# Patient Record
Sex: Female | Born: 1995 | Race: White | Hispanic: No | Marital: Married | Smoking: Never smoker
Health system: Southern US, Community
[De-identification: ages and names within clinical notes are randomized; demographics above are authoritative.]

## PROBLEM LIST (undated history)

## (undated) ENCOUNTER — Inpatient Hospital Stay (HOSPITAL_COMMUNITY): Payer: Self-pay

## (undated) DIAGNOSIS — K589 Irritable bowel syndrome without diarrhea: Secondary | ICD-10-CM

## (undated) DIAGNOSIS — J45909 Unspecified asthma, uncomplicated: Secondary | ICD-10-CM

## (undated) HISTORY — DX: Unspecified asthma, uncomplicated: J45.909

## (undated) HISTORY — DX: Irritable bowel syndrome, unspecified: K58.9

## (undated) HISTORY — PX: WISDOM TOOTH EXTRACTION: SHX21

---
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
## 2015-01-21 ENCOUNTER — Telehealth: Payer: Self-pay | Admitting: Family Medicine

## 2015-01-21 NOTE — Telephone Encounter (Signed)
Please call the patient and see if they can be seen. [facility]

## 2015-01-21 NOTE — Telephone Encounter (Signed)
Pt's father Caryn Bee said she needed a new prescription for her Qbar 40 mcg inhaler sent to [facility] in [place].  His call back number is [phone]

## 2015-01-21 NOTE — Telephone Encounter (Signed)
Yes, he needs to be seen.-jh

## 2015-01-21 NOTE — Telephone Encounter (Signed)
Patient last saw [facility]. Had 32m RX for Qvar. He has scheduled Asthma appt and was hoping we could call in one Qvar until seen. Is this ok? [facility]

## 2015-01-31 ENCOUNTER — Telehealth: Payer: Self-pay | Admitting: Family Medicine

## 2015-02-04 ENCOUNTER — Ambulatory Visit (INDEPENDENT_AMBULATORY_CARE_PROVIDER_SITE_OTHER): Payer: BC Managed Care – PPO | Admitting: Family Medicine

## 2015-02-04 ENCOUNTER — Encounter: Payer: Self-pay | Admitting: Family Medicine

## 2015-02-04 VITALS — BP 102/65 | HR 60 | Temp 98.0°F | Resp 16 | Ht 64.0 in | Wt 155.6 lb

## 2015-02-04 DIAGNOSIS — J4520 Mild intermittent asthma, uncomplicated: Secondary | ICD-10-CM

## 2015-02-04 DIAGNOSIS — K589 Irritable bowel syndrome without diarrhea: Secondary | ICD-10-CM | POA: Insufficient documentation

## 2015-02-04 DIAGNOSIS — Z23 Encounter for immunization: Secondary | ICD-10-CM | POA: Diagnosis not present

## 2015-02-04 DIAGNOSIS — J45909 Unspecified asthma, uncomplicated: Secondary | ICD-10-CM | POA: Insufficient documentation

## 2015-02-04 MED ORDER — ALBUTEROL SULFATE HFA 108 (90 BASE) MCG/ACT IN AERS: 2.0000 | INHALATION_SPRAY | Freq: Four times a day (QID) | RESPIRATORY_TRACT | Status: DC | PRN

## 2015-02-04 MED ORDER — BECLOMETHASONE DIPROPIONATE 40 MCG/ACT IN AERS: 2.0000 | INHALATION_SPRAY | Freq: Two times a day (BID) | RESPIRATORY_TRACT | Status: DC

## 2015-02-04 NOTE — Progress Notes (Signed)
Name: Janet Edwards   MRN: [id number]    DOB: [birth year]   Date:02/04/2015       Progress Note  Subjective  Chief Complaint  Chief Complaint  Patient presents with  . Asthma    medication refill Qvar    HPI Hx of asthma.  No bad flairs in many yrs.  Some exercise asthma.  Using QVAR only once a day.  Rarely needs Ventolin.  No problem-specific assessment & plan notes found for this encounter.   Past Medical History  Diagnosis Date  . IBS (irritable bowel syndrome)   . Asthma     Social History  Substance Use Topics  . Smoking status: Never Smoker   . Smokeless tobacco: Not on file  . Alcohol Use: No     Current outpatient prescriptions:  .  albuterol (PROVENTIL HFA;VENTOLIN HFA) 108 (90 BASE) MCG/ACT inhaler, Inhale 2 puffs into the lungs every 6 (six) hours as needed for wheezing or shortness of breath., Disp: , Rfl:  .  beclomethasone (QVAR) 40 MCG/ACT inhaler, Inhale 2 puffs into the lungs 2 (two) times daily., Disp: , Rfl:   No Known Allergies  Review of Systems  Constitutional: Negative for fever, chills, weight loss and malaise/fatigue.  HENT: Negative for hearing loss.   Eyes: Negative for blurred vision and double vision.  Respiratory: Positive for wheezing (with exercise and heat.). Negative for cough, sputum production and shortness of breath.   Cardiovascular: Negative for chest pain, palpitations and leg swelling.  Gastrointestinal: Negative for heartburn, abdominal pain and blood in stool.  Genitourinary: Negative for dysuria, urgency and frequency.  Skin: Negative for rash.  Neurological: Negative for dizziness, sensory change, focal weakness, weakness and headaches.      Objective  Filed Vitals:   02/04/15 1104  BP: 102/65  Pulse: 60  Temp: 98 F (36.7 C)  TempSrc: Oral  Resp: 16  Height:  (1.626 m)  Weight: 155 lb 9.6 oz (70.58 kg)     Physical Exam  Constitutional: She is well-developed, well-nourished, and in no distress. No  distress.  HENT:  Head: Normocephalic and atraumatic.  Eyes: Conjunctivae and EOM are normal. Pupils are equal, round, and reactive to light. No scleral icterus.  Neck: Normal range of motion. Neck supple. Carotid bruit is not present. No thyromegaly present.  Cardiovascular: Normal rate, regular rhythm, normal heart sounds and intact distal pulses.  Exam reveals no gallop and no friction rub.   No murmur heard. Pulmonary/Chest: Effort normal and breath sounds normal. No respiratory distress. She has no wheezes. She exhibits no tenderness.  Abdominal: Soft. Bowel sounds are normal. She exhibits no distension, no abdominal bruit and no mass. There is no tenderness.  Lymphadenopathy:    She has no cervical adenopathy.  Vitals reviewed.     No results found for this or any previous visit (from the past 2160 hour(s)).   Assessment & Plan  1. Asthma, mild intermittent, uncomplicated  - albuterol (PROVENTIL HFA;VENTOLIN HFA) 108 (90 BASE) MCG/ACT inhaler; Inhale 2 puffs into the lungs every 6 (six) hours as needed for wheezing or shortness of breath.  Dispense: 1 Inhaler; Refill: 12 - beclomethasone (QVAR) 40 MCG/ACT inhaler; Inhale 2 puffs into the lungs 2 (two) times daily.  Dispense: 1 Inhaler; Refill: 12  2. Need for influenza vaccination

## 2015-02-04 NOTE — Patient Instructions (Signed)
Awareness of severe asthma attack discussed.

## 2015-03-02 NOTE — Telephone Encounter (Signed)
Error

## 2015-09-08 ENCOUNTER — Telehealth: Payer: Self-pay | Admitting: Family Medicine

## 2015-09-08 NOTE — Telephone Encounter (Addendum)
I received a request to complete vaccination forms for college for this patient. She is missing documentation of the required vaccinations I have looked on [employer] and in the old system. Did she get vaccines out of state? Does she have other vaccine records at home? Please call her to determine if we need to update her vaccinations or get more documentation.

## 2015-09-09 NOTE — Telephone Encounter (Signed)
Pt realized she does not need form completed. Will shred forms.

## 2015-09-09 NOTE — Telephone Encounter (Signed)
Patient will check with her mother to see if she has copy of vaccine records. Patient will call office back this afternoon. Patient states she probably is due for some vaccines.

## 2016-11-02 ENCOUNTER — Encounter: Payer: Self-pay | Admitting: Family Medicine

## 2016-11-02 ENCOUNTER — Ambulatory Visit (INDEPENDENT_AMBULATORY_CARE_PROVIDER_SITE_OTHER): Payer: BC Managed Care – PPO | Admitting: Family Medicine

## 2016-11-02 VITALS — BP 106/59 | HR 85 | Temp 98.4°F | Resp 16 | Ht 64.0 in | Wt 146.0 lb

## 2016-11-02 DIAGNOSIS — J029 Acute pharyngitis, unspecified: Secondary | ICD-10-CM

## 2016-11-02 DIAGNOSIS — J4520 Mild intermittent asthma, uncomplicated: Secondary | ICD-10-CM

## 2016-11-02 DIAGNOSIS — J209 Acute bronchitis, unspecified: Secondary | ICD-10-CM | POA: Diagnosis not present

## 2016-11-02 MED ORDER — AZITHROMYCIN 250 MG PO TABS: ORAL_TABLET | ORAL | 0 refills | Status: DC

## 2016-11-02 NOTE — Patient Instructions (Addendum)
Thank you for coming to the clinic today.  1. It sounds like you had an Upper Respiratory Virus that has settled into a Bronchitis, lower respiratory tract infection. I don't have concerns for pneumonia today, and think that this should gradually improve. Once you are feeling better, the cough may take a few weeks to fully resolve. I do hear coarse congestion breath sounds  Start Mucinex (or if prefer can get Mucinex-DM),  May also take DayQuil / NyQuil  - Use Albuterol inhaler 2 puffs every 6 hours around the clock for next 2-3 days  - IF not improving after 24-48 hours, or develop worsening fever, productive cough then start Azithromycin Z pak (antibiotic) 2 tabs day 1, then 1 tab x 4 days, complete entire course even if improved  - Use nasal saline (Simply Saline or Ocean Spray) to flush nasal congestion multiple times a day, may help cough - Drink plenty of fluids to improve congestion  If your symptoms seem to worsen instead of improve over next several days, including significant fever / chills, worsening shortness of breath, worsening wheezing, or nausea / vomiting and can't take medicines - return sooner or go to hospital Emergency Department for more immediate treatment.  Please schedule a Follow-up Appointment to: Return in about 2 weeks (around 11/16/2016), or if symptoms worsen or fail to improve, for Bronchitis.  If you have any other questions or concerns, please feel free to call the clinic or send a message through MyChart. You may also schedule an earlier appointment if necessary.  Additionally, you may be receiving a survey about your experience at our clinic within a few days to 1 week by e-mail or mail. We value your feedback.  Saralyn PilarAlexander Akin Yi, DO [facility]

## 2016-11-02 NOTE — Progress Notes (Signed)
Subjective:    Patient ID: Janet Edwards, female    DOB: [birth year], 21 y.o.   MRN: [id number]  Janet Edwards is a 21 y.o. female presenting on 11/02/2016 for Laryngitis (had sore throat Monday, Tuesday felt better and onset thursday had laryngitis and chest congestion Head pressure )  Patient presents for a same day appointment.  HPI   URI / PHARYNGITIS vs Bronchitis / History of Asthma Reports symptoms started with sore throat when woke up on Monday, did not have throat swelling or white bumps, then felt better for 2 days, and then symptoms progressed to more chest congestion and sore with coughing. - Tried Mucinex last night without significant relief. Tried some Tylenol initially then not resumed. - History of asthma, mild intermittent mostly exercise, rarely using, has not used albuterol in >1 year no recent flares, has not tried albuterol recently. never on singulair  - Admits sore throat and "thicker mucus" on swallowing, cough but difficulty mobilizing mucus - Denies shortness of breath, fevers/chills, sweats, abdominal pain, nausea, vomiting diarrhea, sinus or nasal congestion, ear pain or pressure   Social History  Substance Use Topics  . Smoking status: Never Smoker  . Smokeless tobacco: Never Used  . Alcohol use No    Review of Systems Per HPI unless specifically indicated above     Objective:    BP (!) 106/59   Pulse 85   Temp 98.4 F (36.9 C) (Oral)   Resp 16   Ht 5\' 4"  (1.626 m)   Wt 146 lb (66.2 kg)   SpO2 100%   BMI 25.06 kg/m   Wt Readings from Last 3 Encounters:  11/02/16 146 lb (66.2 kg)  02/04/15 155 lb 9.6 oz (70.6 kg) (84 %, Z= 1.01)*  12/18/12 139 lb (63 kg) (75 %, Z= 0.68)*   * Growth percentiles are based on CDC 2-20 Years data.    Physical Exam  Constitutional: She is oriented to person, place, and time. She appears well-developed and well-nourished. No distress.  Mostly well appearing, comfortable, cooperative  HENT:  Head:  Normocephalic and atraumatic.  Mouth/Throat: Oropharynx is clear and moist.  Frontal sinuses mild discomfort to palpation / maxillary sinuses non-tender. Nares patent without purulence or edema. Bilateral TMs clear without erythema, effusion or bulging. Oropharynx with mild generalized erythema posteriorly, non specific, without exudates, edema or asymmetry.  Eyes: Conjunctivae are normal. Right eye exhibits no discharge. Left eye exhibits no discharge.  Neck: Normal range of motion. Neck supple.  Cardiovascular: Normal rate, regular rhythm, normal heart sounds and intact distal pulses.   No murmur heard. Pulmonary/Chest: Effort normal. No respiratory distress. She has no wheezes. She has no rales.  Mild coarse transmitted upper airway breath sounds, clear with cough. No focal crackles or rhonchi. No wheezing.  Musculoskeletal: Normal range of motion. She exhibits no edema.  Lymphadenopathy:    She has no cervical adenopathy.  Neurological: She is alert and oriented to person, place, and time.  Skin: Skin is warm and dry. No rash noted. She is not diaphoretic. No erythema.  Psychiatric: She has a normal mood and affect. Her behavior is normal.  Nursing note and vitals reviewed.  No results found for this or any previous visit.    Assessment & Plan:   Problem List Items Addressed This Visit    Asthma    Stable without evidence of acute exacerbation. Well controlled or resolving mild intermittent asthma - was on Qvar 2 puff BID now, not using  regularly. Rare flares, last >1 year ago, not using albuterol. Trigger mostly exercise induced No wheezing today but some mild coarse transmitted sounds, may be developing slight obstruction  Plan: 1. See A&P, likely viral self limited 2. Hold prednisone 3. Recommend brief trial on existing Albuterol 2 puffs q 4-6 hour daily for 48 hours then PRN 4. Future can STOP Qvar, and try without to see how well controlled, consider Singulair for  exercise-induced 5. Return criteria given to follow-up vs when to go to ED - if worsening by next week despite antibiotic, may consider Prednisone burst       Other Visit Diagnoses    Acute bronchitis, unspecified organism    -  Primary  Consistent with worsening bronchitis in setting of likely viral URI - Afebrile, no focal signs of infection (not consistent with pneumonia by history or exam), no evidence sinusitis. Mild coarse breath sounds on exam concern for some bronchospasm (with known asthma), but not consistent with acute flare.  Plan: 1. Reassurance, continue supportive measures - resume Mucinex daily for 7-10 days for chest congestion, add DayQuil/NyQuil other meds PRN 2. If not improved 24-48 hours, since weekend coming up, agreed on decision with patient for Azithromycin Z-pak rx printed only start if worsening, otherwise don't take if improving 3. Use albuterol 4. Offered tessalon perls, declined 5. Nasal saline, lozenges, tea with honey/lemon 6. Return criteria reviewed, follow-up within 1-2 week if not improved     Relevant Medications   azithromycin (ZITHROMAX Z-PAK) 250 MG tablet   Pharyngitis, unspecified etiology   - Significant cough, not meeting criteria for rapid strep today based on exam. - Follow-up if not improved, likely from cough / bronchitis drainage URI        Meds ordered this encounter  Medications  . JUNEL 1/20 1-20 MG-MCG tablet  . azithromycin (ZITHROMAX Z-PAK) 250 MG tablet    Sig: Start within 24-48 hours if not improved. Take 2 tabs (500mg  total) on Day 1. Take 1 tab (250mg ) daily for next 4 days.    Dispense:  6 tablet    Refill:  0      Follow up plan: Return in about 2 weeks (around 11/16/2016), or if symptoms worsen or fail to improve, for Bronchitis.  Additionally, last visit in 2016 with prior PCP Dr Juanetta GoslingHawkins. Advised that we recommend yearly visit for health maintenance/annual physical, she may follow-up with myself or Wilhelmina McardleLauren  Kennedy, AGPCNP-BC  Saralyn PilarAlexander Karamalegos, DO [facility] 11/02/2016, 12:34 PM

## 2016-11-02 NOTE — Assessment & Plan Note (Addendum)
Stable without evidence of acute exacerbation. Well controlled or resolving mild intermittent asthma - was on Qvar 2 puff BID now, not using regularly. Rare flares, last >1 year ago, not using albuterol. Trigger mostly exercise induced No wheezing today but some mild coarse transmitted sounds, may be developing slight obstruction  Plan: 1. See A&P, likely viral self limited 2. Hold prednisone 3. Recommend brief trial on existing Albuterol 2 puffs q 4-6 hour daily for 48 hours then PRN 4. Future can STOP Qvar, and try without to see how well controlled, consider Singulair for exercise-induced 5. Return criteria given to follow-up vs when to go to ED - if worsening by next week despite antibiotic, may consider Prednisone burst

## 2019-08-10 ENCOUNTER — Ambulatory Visit: Payer: Self-pay | Attending: Internal Medicine

## 2019-08-10 DIAGNOSIS — Z20822 Contact with and (suspected) exposure to covid-19: Secondary | ICD-10-CM

## 2019-08-11 LAB — SARS-COV-2, NAA 2 DAY TAT

## 2019-08-11 LAB — NOVEL CORONAVIRUS, NAA: SARS-CoV-2, NAA: NOT DETECTED

## 2020-05-21 NOTE — L&D Delivery Note (Signed)
Delivery Note At 8:53 AM a viable female was delivered via Vaginal, Spontaneous (Presentation: Right Occiput Anterior).  APGAR: 8, 9; weight pending.   Placenta status: Spontaneous, Intact.  Cord: 3 vessels with the following complications: tight nuchal x 1; reduced.  Cord pH: n/a.  Anesthesia: Epidural Episiotomy: None Lacerations: 2nd degree Suture Repair: 3.0 vicryl rapide Est. Blood Loss (mL): 300  Mom to postpartum.  Baby to Couplet care / Skin to Skin.  Mitchel Honour 03/02/2021, 9:24 AM

## 2020-07-15 ENCOUNTER — Inpatient Hospital Stay (HOSPITAL_COMMUNITY)
Admission: AD | Admit: 2020-07-15 | Discharge: 2020-07-15 | Disposition: A | Discharge status: Home / Self Care | Payer: 59 | Attending: Obstetrics and Gynecology | Admitting: Obstetrics and Gynecology

## 2020-07-15 ENCOUNTER — Encounter (HOSPITAL_COMMUNITY): Payer: Self-pay | Admitting: Obstetrics and Gynecology

## 2020-07-15 ENCOUNTER — Other Ambulatory Visit: Payer: Self-pay

## 2020-07-15 DIAGNOSIS — Z880 Allergy status to penicillin: Secondary | ICD-10-CM | POA: Diagnosis not present

## 2020-07-15 DIAGNOSIS — O219 Vomiting of pregnancy, unspecified: Secondary | ICD-10-CM | POA: Diagnosis not present

## 2020-07-15 DIAGNOSIS — O210 Mild hyperemesis gravidarum: Secondary | ICD-10-CM

## 2020-07-15 DIAGNOSIS — Z3A01 Less than 8 weeks gestation of pregnancy: Secondary | ICD-10-CM | POA: Diagnosis not present

## 2020-07-15 LAB — BASIC METABOLIC PANEL
Anion gap: 19 — ABNORMAL HIGH (ref 5–15)
BUN: 10 mg/dL (ref 6–20)
CO2: 16 mmol/L — ABNORMAL LOW (ref 22–32)
Calcium: 9.4 mg/dL (ref 8.9–10.3)
Chloride: 103 mmol/L (ref 98–111)
Creatinine, Ser: 0.69 mg/dL (ref 0.44–1.00)
GFR, Estimated: >60 mL/min (ref >60)
Glucose, Bld: 92 mg/dL (ref 70–99)
Potassium: 4.2 mmol/L (ref 3.5–5.1)
Sodium: 138 mmol/L (ref 135–145)

## 2020-07-15 LAB — URINALYSIS, ROUTINE W REFLEX MICROSCOPIC
Bilirubin Urine: NEGATIVE
Glucose, UA: NEGATIVE mg/dL
Hgb urine dipstick: NEGATIVE
Ketones, ur: 80 mg/dL — AB
Nitrite: NEGATIVE
Protein, ur: NEGATIVE mg/dL
Specific Gravity, Urine: 1.026 (ref 1.005–1.030)
pH: 5 (ref 5.0–8.0)

## 2020-07-15 LAB — CBC WITH DIFFERENTIAL/PLATELET
Abs Immature Granulocytes: 0.03 10*3/uL (ref 0.00–0.07)
Basophils Absolute: 0 10*3/uL (ref 0.0–0.1)
Basophils Relative: 0 %
Eosinophils Absolute: 0 10*3/uL (ref 0.0–0.5)
Eosinophils Relative: 0 %
HCT: 42.4 % (ref 36.0–46.0)
Hemoglobin: 15.2 g/dL — ABNORMAL HIGH (ref 12.0–15.0)
Immature Granulocytes: 0 %
Lymphocytes Relative: 12 %
Lymphs Abs: 1.4 10*3/uL (ref 0.7–4.0)
MCH: 33.6 pg (ref 26.0–34.0)
MCHC: 35.8 g/dL (ref 30.0–36.0)
MCV: 93.6 fL (ref 80.0–100.0)
Monocytes Absolute: 0.6 10*3/uL (ref 0.1–1.0)
Monocytes Relative: 5 %
Neutro Abs: 9.7 10*3/uL — ABNORMAL HIGH (ref 1.7–7.7)
Neutrophils Relative %: 83 %
Platelets: 333 10*3/uL (ref 150–400)
RBC: 4.53 MIL/uL (ref 3.87–5.11)
RDW: 11.7 % (ref 11.5–15.5)
WBC: 11.7 10*3/uL — ABNORMAL HIGH (ref 4.0–10.5)
nRBC: 0 % (ref 0.0–0.2)

## 2020-07-15 LAB — POCT PREGNANCY, URINE: Preg Test, Ur: POSITIVE — AB

## 2020-07-15 MED ORDER — METOCLOPRAMIDE HCL 5 MG/ML IJ SOLN
10.0000 mg | Freq: Once | INTRAMUSCULAR | Status: AC
  Administered 2020-07-15: 10 mg via INTRAVENOUS
  Filled 2020-07-15: qty 2

## 2020-07-15 MED ORDER — FAMOTIDINE IN NACL 20-0.9 MG/50ML-% IV SOLN
20.0000 mg | Freq: Once | INTRAVENOUS | Status: AC
  Administered 2020-07-15: 20 mg via INTRAVENOUS
  Filled 2020-07-15: qty 50

## 2020-07-15 MED ORDER — METOCLOPRAMIDE HCL 10 MG PO TABS: 10.0000 mg | ORAL_TABLET | Freq: Four times a day (QID) | ORAL | 0 refills | Status: DC

## 2020-07-15 MED ORDER — PROMETHAZINE HCL 25 MG/ML IJ SOLN
12.5000 mg | Freq: Once | INTRAMUSCULAR | Status: AC
  Administered 2020-07-15: 12.5 mg via INTRAVENOUS
  Filled 2020-07-15: qty 1

## 2020-07-15 MED ORDER — LACTATED RINGERS IV BOLUS
1000.0000 mL | Freq: Once | INTRAVENOUS | Status: AC
  Administered 2020-07-15: 1000 mL via INTRAVENOUS

## 2020-07-15 MED ORDER — PROMETHAZINE HCL 12.5 MG PO TABS: 12.5000 mg | ORAL_TABLET | Freq: Four times a day (QID) | ORAL | 0 refills | Status: DC | PRN

## 2020-07-15 NOTE — MAU Provider Note (Signed)
History     CSN: [id number]  Arrival date and time: 07/15/20 7001   Event Date/Time   First Provider Initiated Contact with Patient 07/15/20 [phone]      Chief Complaint  Patient presents with  . Emesis   Janet Edwards is a 25 y.o. G1P0 at [redacted]w[redacted]d who receives care at [facility].  She presents today for Emesis.  She states her symptoms started "maybe this past weekend, but has been getting worse the last 2 days."  She states she has been unable to keep anything down "liquid or solid."  Patient SO-William reports they have tried water, pedialyte, applesauce, toast, and has not been successful. She states she had about 10-12 incidents of vomiting yesterday.  She reports "I do a lot of dry-heaving because I don't have a lot to throw up at this point." She reports taking B6 and Unisom yesterday afternoon without success.  She states she tried to take an additional dose last night, but also threw this up.  She reports she tried taking some previously prescribed Zofran, prior to bed, with some improvement in her symptoms.    OB History    Gravida  1   Para      Term      Preterm      AB  0   Living  0     SAB  0   IAB      Ectopic      Multiple      Live Births              History reviewed. No pertinent past medical history.  History reviewed. No pertinent surgical history.  History reviewed. No pertinent family history.  Social History   Tobacco Use  . Smoking status: Never Smoker  . Smokeless tobacco: Never Used  Vaping Use  . Vaping Use: Never used  Substance Use Topics  . Alcohol use: Not Currently    Comment: socially   . Drug use: Never    Allergies:  Allergies  Allergen Reactions  . Penicillins     Mother told her she was unsure of what reaction     Medications Prior to Admission  Medication Sig Dispense Refill Last Dose  . Prenatal Vit-Fe Fumarate-FA (PREPLUS) 27-1 MG TABS Take 1 tablet by mouth daily.   Past Week at Unknown time   . pyridOXINE (VITAMIN B-6) 100 MG tablet Take 100 mg by mouth daily.   07/14/2020 at Unknown time    Review of Systems  Constitutional: Negative for chills and fever.  Respiratory: Positive for shortness of breath (For last 2 days after vomiting). Negative for cough.   Gastrointestinal: Positive for nausea and vomiting. Negative for abdominal pain and diarrhea.  Genitourinary: Negative for difficulty urinating, dysuria, vaginal bleeding and vaginal discharge.  Musculoskeletal: Negative for back pain.  Neurological: Positive for headaches. Negative for dizziness and light-headedness.   Physical Exam   Blood pressure 131/77, pulse 91, temperature 98.7 F (37.1 C), resp. rate 18, height 5\' 4"  (1.626 m), weight 71.2 kg, last menstrual period 06/01/2020.  Physical Exam Constitutional:      General: She is not in acute distress.    Appearance: Normal appearance.  HENT:     Head: Normocephalic and atraumatic.  Eyes:     Conjunctiva/sclera: Conjunctivae normal.  Cardiovascular:     Rate and Rhythm: Normal rate and regular rhythm.     Heart sounds: Normal heart sounds.  Pulmonary:     Effort: Pulmonary  effort is normal.  Abdominal:     Palpations: Abdomen is soft.     Tenderness: There is no abdominal tenderness.  Musculoskeletal:        General: Normal range of motion.     Cervical back: Normal range of motion.  Skin:    General: Skin is warm and dry.  Neurological:     Mental Status: She is alert and oriented to person, place, and time.  Psychiatric:        Mood and Affect: Mood normal.        Behavior: Behavior normal.        Thought Content: Thought content normal.     MAU Course  Procedures Results for orders placed or performed during the hospital encounter of 07/15/20 (from the past 24 hour(s))  Pregnancy, urine POC     Status: Abnormal   Collection Time: 07/15/20  8:40 AM  Result Value Ref Range   Preg Test, Ur POSITIVE (A) NEGATIVE  Urinalysis, Routine w reflex  microscopic Urine, Clean Catch     Status: Abnormal   Collection Time: 07/15/20  8:42 AM  Result Value Ref Range   Color, Urine AMBER (A) YELLOW   APPearance HAZY (A) CLEAR   Specific Gravity, Urine 1.026 1.005 - 1.030   pH 5.0 5.0 - 8.0   Glucose, UA NEGATIVE NEGATIVE mg/dL   Hgb urine dipstick NEGATIVE NEGATIVE   Bilirubin Urine NEGATIVE NEGATIVE   Ketones, ur 80 (A) NEGATIVE mg/dL   Protein, ur NEGATIVE NEGATIVE mg/dL   Nitrite NEGATIVE NEGATIVE   Leukocytes,Ua MODERATE (A) NEGATIVE   RBC / HPF 0-5 0 - 5 RBC/hpf   WBC, UA 11-20 0 - 5 WBC/hpf   Bacteria, UA RARE (A) NONE SEEN   Squamous Epithelial / LPF 11-20 0 - 5   Mucus PRESENT   CBC with Differential/Platelet     Status: Abnormal   Collection Time: 07/15/20  9:49 AM  Result Value Ref Range   WBC 11.7 (H) 4.0 - 10.5 K/uL   RBC 4.53 3.87 - 5.11 MIL/uL   Hemoglobin 15.2 (H) 12.0 - 15.0 g/dL   HCT 35.0 09.3 - 81.8 %   MCV 93.6 80.0 - 100.0 fL   MCH 33.6 26.0 - 34.0 pg   MCHC 35.8 30.0 - 36.0 g/dL   RDW 29.9 37.1 - 69.6 %   Platelets 333 150 - 400 K/uL   nRBC 0.0 0.0 - 0.2 %   Neutrophils Relative % 83 %   Neutro Abs 9.7 (H) 1.7 - 7.7 K/uL   Lymphocytes Relative 12 %   Lymphs Abs 1.4 0.7 - 4.0 K/uL   Monocytes Relative 5 %   Monocytes Absolute 0.6 0.1 - 1.0 K/uL   Eosinophils Relative 0 %   Eosinophils Absolute 0.0 0.0 - 0.5 K/uL   Basophils Relative 0 %   Basophils Absolute 0.0 0.0 - 0.1 K/uL   Immature Granulocytes 0 %   Abs Immature Granulocytes 0.03 0.00 - 0.07 K/uL  Basic metabolic panel     Status: Abnormal   Collection Time: 07/15/20  9:49 AM  Result Value Ref Range   Sodium 138 135 - 145 mmol/L   Potassium 4.2 3.5 - 5.1 mmol/L   Chloride 103 98 - 111 mmol/L   CO2 16 (L) 22 - 32 mmol/L   Glucose, Bld 92 70 - 99 mg/dL   BUN 10 6 - 20 mg/dL   Creatinine, Ser 7.89 0.44 - 1.00 mg/dL   Calcium 9.4 8.9 -  10.3 mg/dL   GFR, Estimated >01 >75 mL/min   Anion gap 19 (H) 5 - 15    MDM Start IV LR Bolus x  2 Antiemetics PPI Labs: CBC/D, BMP Assessment and Plan  25 year old G1P0 at 6.2 weeks Nausea/Vomiting  -Reviewed POC with patient. -Exam performed and findings discussed.  -Start IV and give LR Bolus -Will give phenergan and pepcid. -Informed that phenergan is for nausea and pepcid should help for c/o "queasy stomach." -Will collect labs.   Cherre Robins 07/15/2020, 9:16 AM   Reassessment (10:53 AM) -Provider to bedside and patient vomiting. -Will order additional bag of LR -Reglan 10mg  ordered -Will continue to monitor and reassess.  -Labs return without significant findings.   Reassessment (12:15 PM) -Patient reports improvement in symptoms. -Informed that phenergan and reglan to be sent to pharmacy for prn usage. -Instructed to continue to take Unisom and B6 as directed by primary ob. -Encouraged small, frequent bland meals throughout the day. -Encouraged to call or return to MAU if symptoms worsen or with the onset of new symptoms. -Discharged to home in stable condition.  MSN, CNM Advanced Practice Provider, [facility]

## 2020-07-15 NOTE — Discharge Instructions (Signed)
Morning Sickness  Morning sickness is when a woman feels nauseous during pregnancy. This nauseous feeling may or may not come with vomiting. It often occurs in the morning, but it can be a problem at any time of day. Morning sickness is most common during the first trimester. In some cases, it may continue throughout pregnancy. Although morning sickness is unpleasant, it is usually harmless unless the woman develops severe and continual vomiting (hyperemesis gravidarum), a condition that requires more intense treatment. What are the causes? The exact cause of this condition is not known, but it seems to be related to normal hormonal changes that occur in pregnancy. What increases the risk? You are more likely to develop this condition if:  You experienced nausea or vomiting before your pregnancy.  You had morning sickness during a previous pregnancy.  You are pregnant with more than one baby, such as twins. What are the signs or symptoms? Symptoms of this condition include:  Nausea.  Vomiting. How is this diagnosed? This condition is usually diagnosed based on your signs and symptoms. How is this treated? In many cases, treatment is not needed for this condition. Making some changes to what you eat may help to control symptoms. Your health care provider may also prescribe or recommend:  Vitamin B6 supplements.  Anti-nausea medicines.  Ginger. Follow these instructions at home: Medicines  Take over-the-counter and prescription medicines only as told by your health care provider. Do not use any prescription, over-the-counter, or herbal medicines for morning sickness without first talking with your health care provider.  Take multivitamins before getting pregnant. This can prevent or decrease the severity of morning sickness in most women. Eating and drinking  Eat a piece of dry toast or crackers before getting out of bed in the morning.  Eat 5 or 6 small meals a day.  Eat dry  and bland foods, such as rice or a baked potato. Foods that are high in carbohydrates are often helpful.  Avoid greasy, fatty, and spicy foods.  Have someone cook for you if the smell of any food causes nausea and vomiting.  If you feel nauseous after taking prenatal vitamins, take the vitamins at night or with a snack.  Eat a protein snack between meals if you are hungry. Nuts, yogurt, and cheese are good options.  Drink fluids throughout the day.  Try ginger ale made with real ginger, ginger tea made from fresh grated ginger, or ginger candies. General instructions  Do not use any products that contain nicotine or tobacco. These products include cigarettes, chewing tobacco, and vaping devices, such as e-cigarettes. If you need help quitting, ask your health care provider.  Get an air purifier to keep the air in your house free of odors.  Get plenty of fresh air.  Try to avoid odors that trigger your nausea.  Consider trying these methods to help relieve symptoms: ? Wearing an acupressure wristband. These wristbands are often worn for seasickness. ? Acupuncture. Contact a health care provider if:  Your home remedies are not working and you need medicine.  You feel dizzy or light-headed.  You are losing weight. Get help right away if:  You have persistent and uncontrolled nausea and vomiting.  You faint.  You have severe pain in your abdomen. Summary  Morning sickness is when a woman feels nauseous during pregnancy. This nauseous feeling may or may not come with vomiting.  Morning sickness is most common during the first trimester.  It often occurs in the   morning, but it can be a problem at any time of day.  In many cases, treatment is not needed for this condition. Making some changes to what you eat may help to control symptoms. This information is not intended to replace advice given to you by your health care provider. Make sure you discuss any questions you have  with your health care provider. Document Revised: 12/21/2019 Document Reviewed: 11/30/2019 Elsevier Patient Education  2021 Elsevier Inc.  

## 2020-07-15 NOTE — MAU Note (Signed)
Pt c/o n/v for several days. Has not been able to keep anything down for 3 days. Called office yesterday and was prescribed B6 and Unisom unable to keep that down. No relief from N/V. Denies any pain, bleeding or discharge. About [redacted] weeks pregnant.

## 2020-08-24 ENCOUNTER — Other Ambulatory Visit: Payer: Self-pay

## 2020-08-24 ENCOUNTER — Encounter (HOSPITAL_COMMUNITY): Payer: Self-pay | Admitting: Obstetrics and Gynecology

## 2020-08-24 ENCOUNTER — Inpatient Hospital Stay (HOSPITAL_COMMUNITY)
Admission: AD | Admit: 2020-08-24 | Discharge: 2020-08-24 | Disposition: A | Discharge status: Home / Self Care | Payer: 59 | Attending: Obstetrics and Gynecology | Admitting: Obstetrics and Gynecology

## 2020-08-24 DIAGNOSIS — Z880 Allergy status to penicillin: Secondary | ICD-10-CM | POA: Insufficient documentation

## 2020-08-24 DIAGNOSIS — O210 Mild hyperemesis gravidarum: Secondary | ICD-10-CM | POA: Diagnosis present

## 2020-08-24 DIAGNOSIS — O219 Vomiting of pregnancy, unspecified: Secondary | ICD-10-CM

## 2020-08-24 DIAGNOSIS — Z3A12 12 weeks gestation of pregnancy: Secondary | ICD-10-CM | POA: Insufficient documentation

## 2020-08-24 LAB — URINALYSIS, ROUTINE W REFLEX MICROSCOPIC
Bilirubin Urine: NEGATIVE
Glucose, UA: NEGATIVE mg/dL
Hgb urine dipstick: NEGATIVE
Ketones, ur: 5 mg/dL — AB
Nitrite: NEGATIVE
Protein, ur: NEGATIVE mg/dL
Specific Gravity, Urine: 1.017 (ref 1.005–1.030)
pH: 7 (ref 5.0–8.0)

## 2020-08-24 MED ORDER — ONDANSETRON 4 MG PO TBDP: 4.0000 mg | ORAL_TABLET | Freq: Three times a day (TID) | ORAL | 0 refills | Status: DC | PRN

## 2020-08-24 MED ORDER — LACTATED RINGERS IV BOLUS
1000.0000 mL | Freq: Once | INTRAVENOUS | Status: AC
  Administered 2020-08-24: 1000 mL via INTRAVENOUS

## 2020-08-24 MED ORDER — SODIUM CHLORIDE 0.9 % IV SOLN
8.0000 mg | Freq: Once | INTRAVENOUS | Status: AC
  Administered 2020-08-24: 8 mg via INTRAVENOUS
  Filled 2020-08-24 (×2): qty 4

## 2020-08-24 MED ORDER — FAMOTIDINE IN NACL 20-0.9 MG/50ML-% IV SOLN
20.0000 mg | Freq: Once | INTRAVENOUS | Status: AC
  Administered 2020-08-24: 20 mg via INTRAVENOUS
  Filled 2020-08-24: qty 50

## 2020-08-24 NOTE — MAU Note (Signed)
Presents with c/o N/V and inability to keep anything down.  States she may need "fluids".  Denies VB.

## 2020-08-24 NOTE — MAU Provider Note (Signed)
History     CSN: [id number]  Arrival date and time: 08/24/20 1237   Event Date/Time   First Provider Initiated Contact with Patient 08/24/20 1404      Chief Complaint  Patient presents with  . Emesis  . Nausea   HPI Janet Edwards is a 25 y.o. G1P0000 at [redacted]w[redacted]d who presents with nausea & vomiting.This has been an ongoing issue with the pregnancy but has worsened the last few days. Was seen in MAU at [redacted] weeks gestation for same issue; was prescribed reglan and phenergan. Reports she stopped taking them after that visit because she felt that they weren't working although she's had decent control of her symptoms since then. Reports vomiting 10 times today. Has not taken anything to treat her symptoms. Denies abdominal pain, fever, diarrhea, vaginal bleeding. Goes to [facility] for prenatal care.   OB History    Gravida  1   Para  0   Term  0   Preterm  0   AB  0   Living        SAB  0   IAB  0   Ectopic  0   Multiple      Live Births              Past Medical History:  Diagnosis Date  . Asthma   . IBS (irritable bowel syndrome)     Past Surgical History:  Procedure Laterality Date  . WISDOM TOOTH EXTRACTION      Family History  Problem Relation Age of Onset  . Cancer Maternal Grandfather        prostate cancer  . Atrial fibrillation Paternal Grandfather     Social History   Tobacco Use  . Smoking status: Never Smoker  . Smokeless tobacco: Never Used  Vaping Use  . Vaping Use: Never used  Substance Use Topics  . Alcohol use: Not Currently    Comment: socially   . Drug use: Never    Allergies:  Allergies  Allergen Reactions  . Penicillins     Mother told her she was unsure of what reaction   . Pneumococcal Vaccine Other (See Comments)    Hematoma with mild to moderate localized reaction  . Vicks Dayquil Cough [Dextromethorphan Hbr] Nausea And Vomiting    Per pt; states she had n/v two different times after taking it      Medications Prior to Admission  Medication Sig Dispense Refill Last Dose  . metoCLOPramide (REGLAN) 10 MG tablet Take 1 tablet (10 mg total) by mouth every 6 (six) hours. (Patient not taking: Reported on 08/24/2020) 30 tablet 0 Not Taking at Unknown time  . Prenatal Vit-Fe Fumarate-FA (PREPLUS) 27-1 MG TABS Take 1 tablet by mouth daily. (Patient not taking: Reported on 08/24/2020)   Not Taking at Unknown time  . promethazine (PHENERGAN) 12.5 MG tablet Take 1 tablet (12.5 mg total) by mouth every 6 (six) hours as needed for nausea or vomiting. (Patient not taking: Reported on 08/24/2020) 30 tablet 0 Not Taking at Unknown time  . pyridOXINE (VITAMIN B-6) 100 MG tablet Take 100 mg by mouth daily. (Patient not taking: Reported on 08/24/2020)   Not Taking at Unknown time    Review of Systems  Constitutional: Negative.   Gastrointestinal: Positive for nausea and vomiting. Negative for abdominal pain, constipation and diarrhea.  Genitourinary: Negative.    Physical Exam   Blood pressure 106/75, pulse (!) 104, temperature 98.2 F (36.8 C), temperature source Oral, resp.  rate 20, height 5\' 4"  (1.626 m), weight 70.1 kg, last menstrual period 06/01/2020, SpO2 96 %.  Physical Exam Vitals and nursing note reviewed.  Constitutional:      General: She is not in acute distress.    Appearance: Normal appearance.  HENT:     Head: Normocephalic and atraumatic.  Pulmonary:     Effort: Pulmonary effort is normal. No respiratory distress.  Neurological:     Mental Status: She is alert.  Psychiatric:        Mood and Affect: Mood normal.        Behavior: Behavior normal.     MAU Course  Procedures Results for orders placed or performed during the hospital encounter of 08/24/20 (from the past 24 hour(s))  Urinalysis, Routine w reflex microscopic Urine, Clean Catch     Status: Abnormal   Collection Time: 08/24/20  1:24 PM  Result Value Ref Range   Color, Urine YELLOW YELLOW   APPearance CLOUDY (A)  CLEAR   Specific Gravity, Urine 1.017 1.005 - 1.030   pH 7.0 5.0 - 8.0   Glucose, UA NEGATIVE NEGATIVE mg/dL   Hgb urine dipstick NEGATIVE NEGATIVE   Bilirubin Urine NEGATIVE NEGATIVE   Ketones, ur 5 (A) NEGATIVE mg/dL   Protein, ur NEGATIVE NEGATIVE mg/dL   Nitrite NEGATIVE NEGATIVE   Leukocytes,Ua LARGE (A) NEGATIVE   RBC / HPF 0-5 0 - 5 RBC/hpf   WBC, UA 11-20 0 - 5 WBC/hpf   Bacteria, UA RARE (A) NONE SEEN   Squamous Epithelial / LPF 11-20 0 - 5   Mucus PRESENT    Amorphous Crystal PRESENT     MDM FHT present via doppler Pt vomited once in MAU Iv fluids, zofran & pepcid given. Reports some improvement in symptoms. Wants rx for zofran. .  Assessment and Plan   1. Nausea and vomiting during pregnancy prior to [redacted] weeks gestation   2. [redacted] weeks gestation of pregnancy    -rx zofran -discussed taking other antiemetics as prescribed & limited zofran use due to side effect of constipation -keep f/u with ob  10/24/20 08/24/2020, 2:04 PM

## 2020-08-24 NOTE — Discharge Instructions (Signed)
Morning Sickness  Morning sickness is when a woman feels nauseous during pregnancy. This nauseous feeling may or may not come with vomiting. It often occurs in the morning, but it can be a problem at any time of day. Morning sickness is most common during the first trimester. In some cases, it may continue throughout pregnancy. Although morning sickness is unpleasant, it is usually harmless unless the woman develops severe and continual vomiting (hyperemesis gravidarum), a condition that requires more intense treatment. What are the causes? The exact cause of this condition is not known, but it seems to be related to normal hormonal changes that occur in pregnancy. What increases the risk? You are more likely to develop this condition if:  You experienced nausea or vomiting before your pregnancy.  You had morning sickness during a previous pregnancy.  You are pregnant with more than one baby, such as twins. What are the signs or symptoms? Symptoms of this condition include:  Nausea.  Vomiting. How is this diagnosed? This condition is usually diagnosed based on your signs and symptoms. How is this treated? In many cases, treatment is not needed for this condition. Making some changes to what you eat may help to control symptoms. Your health care provider may also prescribe or recommend:  Vitamin B6 supplements.  Anti-nausea medicines.  Ginger. Follow these instructions at home: Medicines  Take over-the-counter and prescription medicines only as told by your health care provider. Do not use any prescription, over-the-counter, or herbal medicines for morning sickness without first talking with your health care provider.  Take multivitamins before getting pregnant. This can prevent or decrease the severity of morning sickness in most women. Eating and drinking  Eat a piece of dry toast or crackers before getting out of bed in the morning.  Eat 5 or 6 small meals a day.  Eat dry  and bland foods, such as rice or a baked potato. Foods that are high in carbohydrates are often helpful.  Avoid greasy, fatty, and spicy foods.  Have someone cook for you if the smell of any food causes nausea and vomiting.  If you feel nauseous after taking prenatal vitamins, take the vitamins at night or with a snack.  Eat a protein snack between meals if you are hungry. Nuts, yogurt, and cheese are good options.  Drink fluids throughout the day.  Try ginger ale made with real ginger, ginger tea made from fresh grated ginger, or ginger candies. General instructions  Do not use any products that contain nicotine or tobacco. These products include cigarettes, chewing tobacco, and vaping devices, such as e-cigarettes. If you need help quitting, ask your health care provider.  Get an air purifier to keep the air in your house free of odors.  Get plenty of fresh air.  Try to avoid odors that trigger your nausea.  Consider trying these methods to help relieve symptoms: ? Wearing an acupressure wristband. These wristbands are often worn for seasickness. ? Acupuncture. Contact a health care provider if:  Your home remedies are not working and you need medicine.  You feel dizzy or light-headed.  You are losing weight. Get help right away if:  You have persistent and uncontrolled nausea and vomiting.  You faint.  You have severe pain in your abdomen. Summary  Morning sickness is when a woman feels nauseous during pregnancy. This nauseous feeling may or may not come with vomiting.  Morning sickness is most common during the first trimester.  It often occurs in the   morning, but it can be a problem at any time of day.  In many cases, treatment is not needed for this condition. Making some changes to what you eat may help to control symptoms. This information is not intended to replace advice given to you by your health care provider. Make sure you discuss any questions you have  with your health care provider. Document Revised: 12/21/2019 Document Reviewed: 11/30/2019 Elsevier Patient Education  2021 Elsevier Inc.  

## 2020-10-10 ENCOUNTER — Other Ambulatory Visit: Payer: Self-pay | Admitting: Obstetrics and Gynecology

## 2020-10-10 DIAGNOSIS — O0992 Supervision of high risk pregnancy, unspecified, second trimester: Secondary | ICD-10-CM

## 2020-10-11 ENCOUNTER — Encounter: Payer: Self-pay | Admitting: *Deleted

## 2020-10-11 ENCOUNTER — Other Ambulatory Visit: Payer: Self-pay

## 2020-10-11 ENCOUNTER — Ambulatory Visit: Payer: 59 | Admitting: *Deleted

## 2020-10-11 ENCOUNTER — Ambulatory Visit: Payer: 59 | Attending: Obstetrics and Gynecology

## 2020-10-11 VITALS — BP 116/59 | HR 70

## 2020-10-11 DIAGNOSIS — O0992 Supervision of high risk pregnancy, unspecified, second trimester: Secondary | ICD-10-CM | POA: Diagnosis present

## 2020-10-11 DIAGNOSIS — O26872 Cervical shortening, second trimester: Secondary | ICD-10-CM | POA: Insufficient documentation

## 2020-10-11 DIAGNOSIS — O321XX0 Maternal care for breech presentation, not applicable or unspecified: Secondary | ICD-10-CM

## 2020-10-11 DIAGNOSIS — Z3A18 18 weeks gestation of pregnancy: Secondary | ICD-10-CM

## 2020-10-11 DIAGNOSIS — O3432 Maternal care for cervical incompetence, second trimester: Secondary | ICD-10-CM

## 2020-10-11 IMAGING — US US MFM OB TRANSVAGINAL
1 series · 15 of 28 positions shown · non-contrast
Comparison: none

[Series 2: us mfm ob transvaginal · 55 acquisitions, 15 frames shown]
[im 1/55]
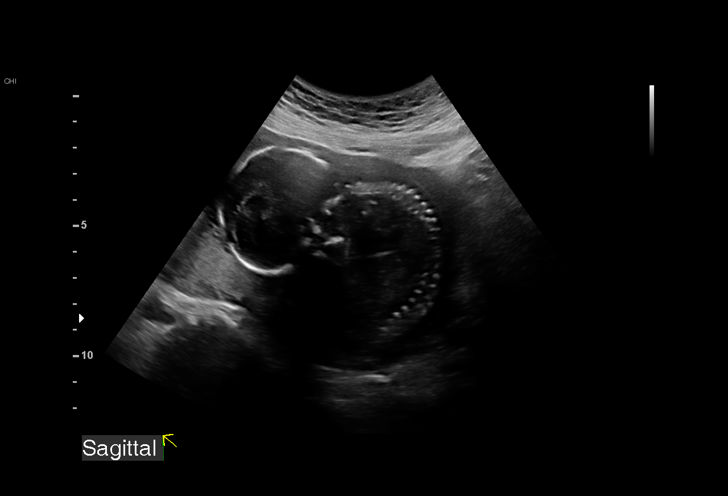
[im 5/55]
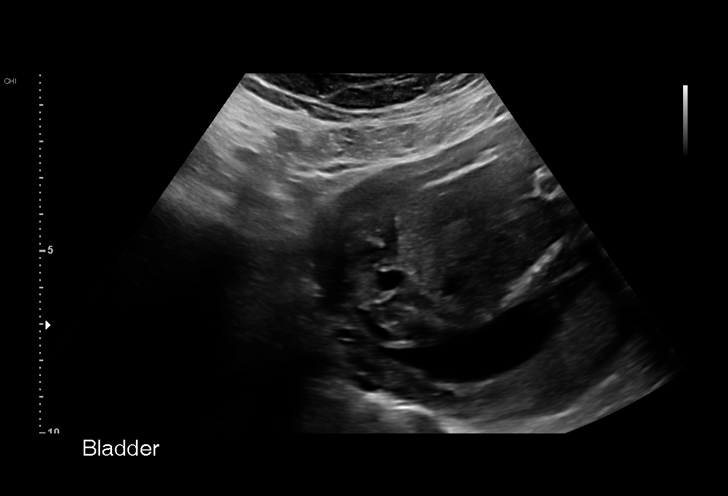
[im 9/55]
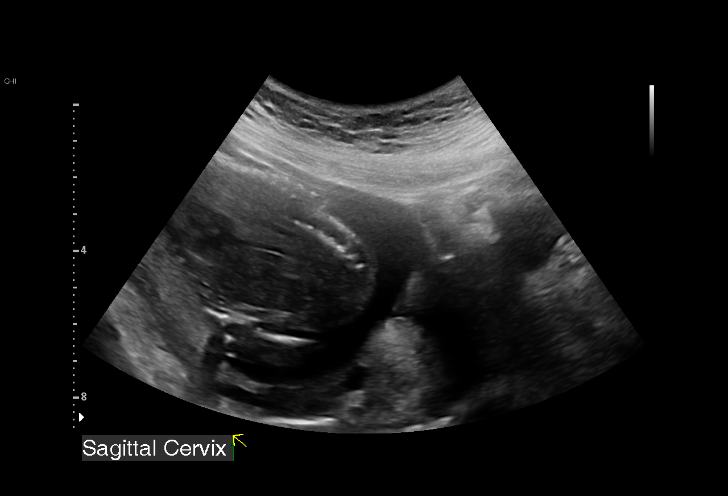
[im 13/55]
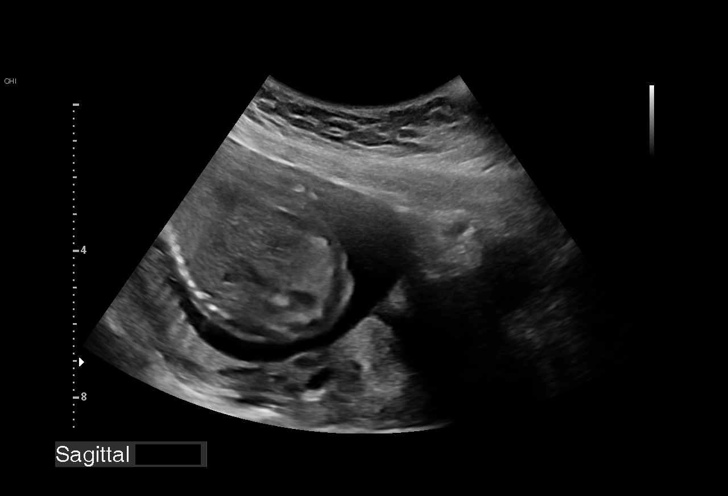
[im 17/55]
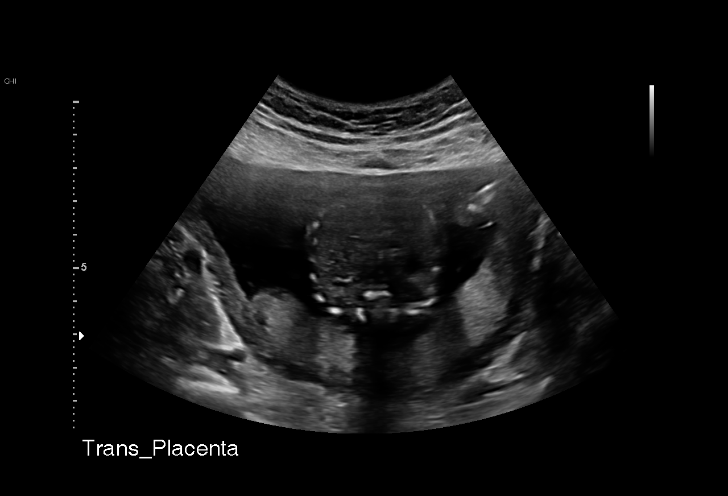
[im 21/55]
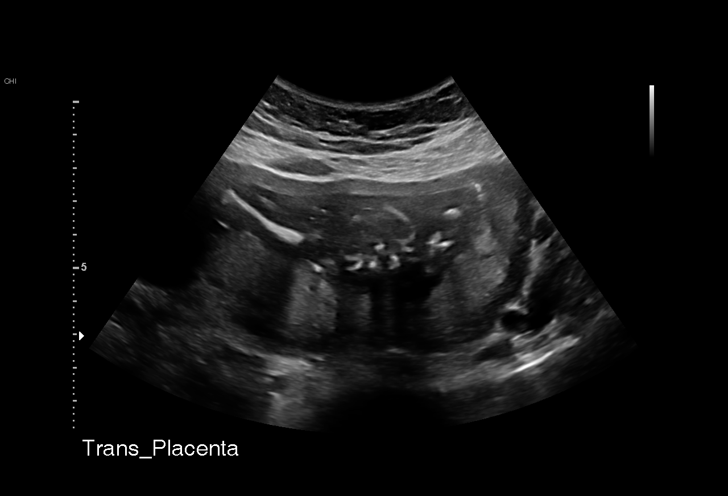
[im 25/55]
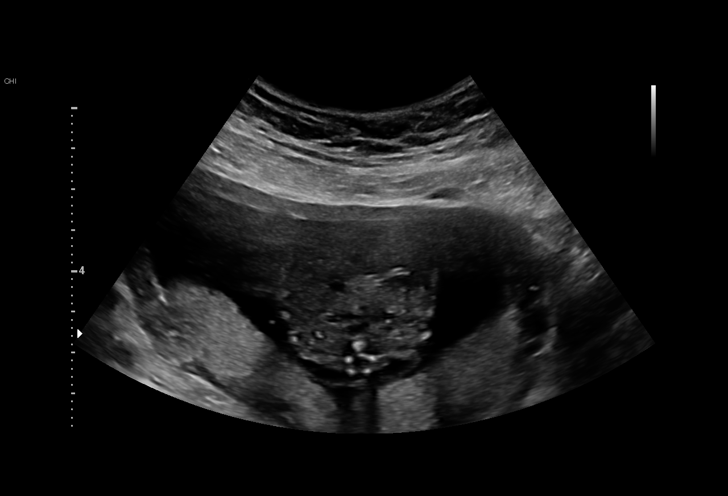
[im 29/55]
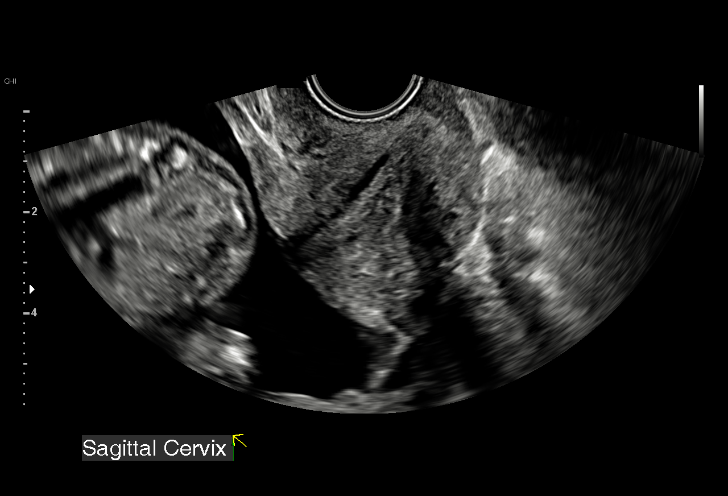
[im 31/55]
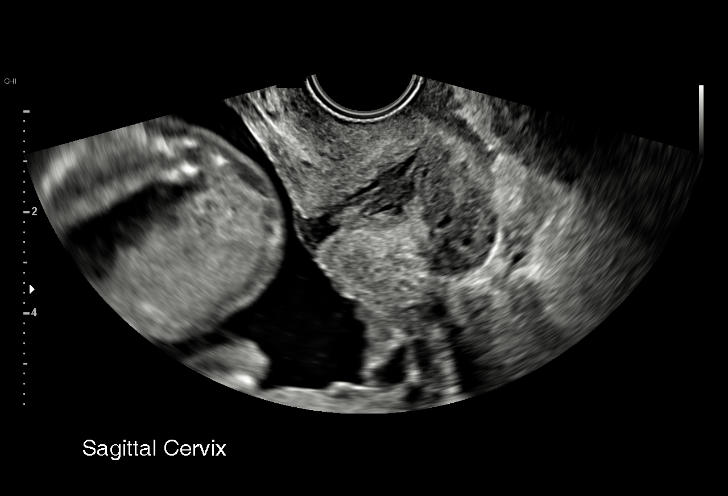
[im 35/55]
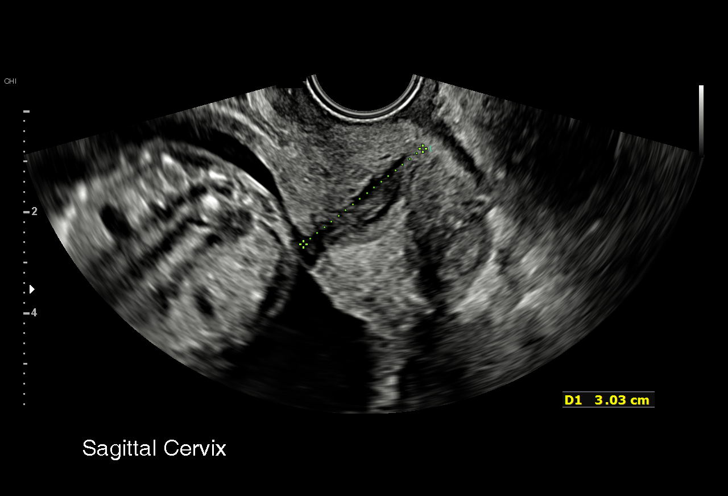
[im 39/55]
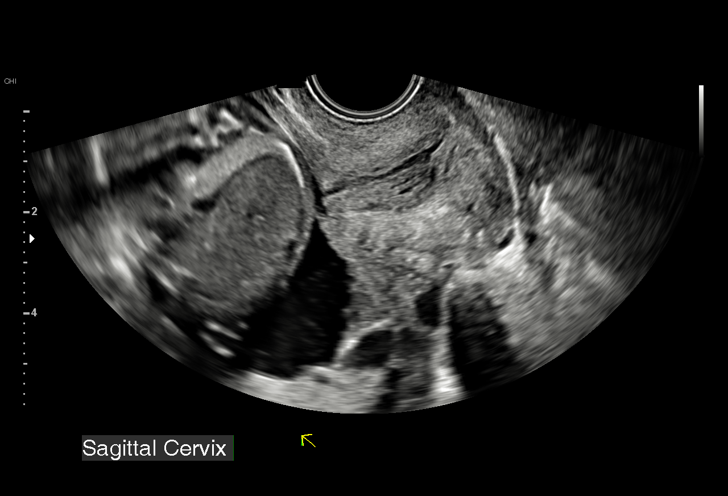
[im 43/55]
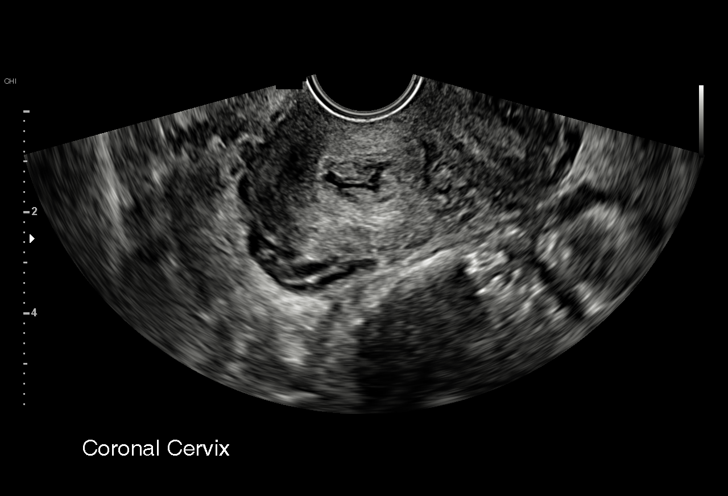
[im 47/55]
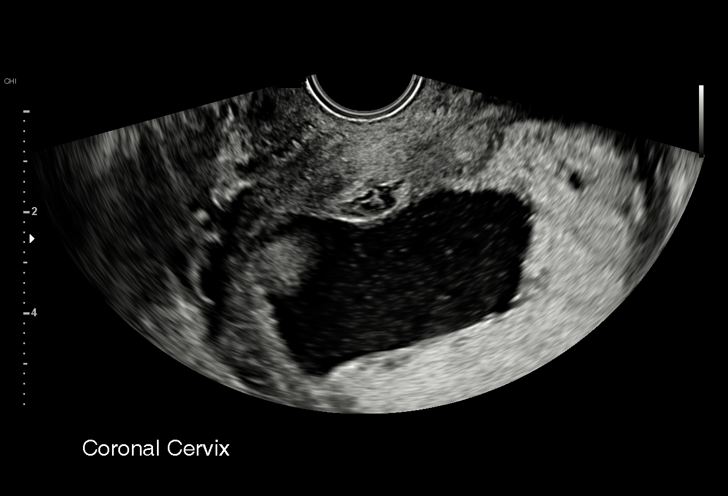
[im 51/55]
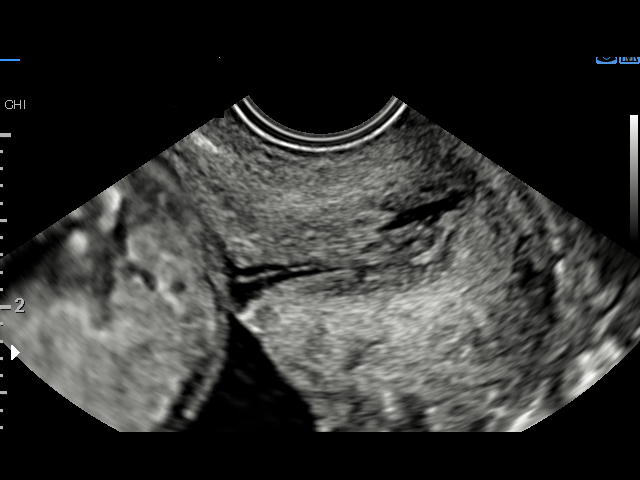
[im 55/55]
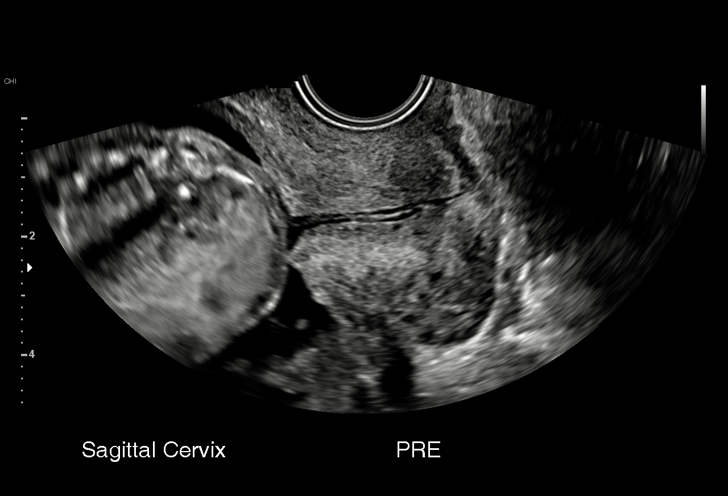

[15 of 28 positions shown; findings below may reference images not displayed]

2  US MFM OB LIMITED                     76815.01    JIM

Indications

 18 weeks gestation of pregnancy
 Cervical insufficiency, 2nd (suspected at      [RK]
 office ultrasound)
Fetal Evaluation

 Num Of Fetuses:         1
 Fetal Heart Rate(bpm):  157
 Cardiac Activity:       Observed
 Presentation:           Breech
 Placenta:               Posterior
 P. Cord Insertion:      Not well visualized

 Amniotic Fluid
 AFI FV:      Within normal limits

                             Largest Pocket(cm)

Biometry

 LV:        4.7  mm
OB History
 Gravidity:    2          SAB:   1
Gestational Age

 LMP:           18w 6d        Date:  [DATE]                 EDD:   [DATE]
 Best:          18w 6d     Det. By:  LMP  ([DATE])          EDD:   [DATE]
Anatomy

 Ventricles:            Appears normal         Kidneys:                Appear normal
 Stomach:               Appears normal, left   Bladder:                Appears normal
                        sided
Cervix Uterus Adnexa

 Cervix
 Length:            3.1  cm.
 Normal appearance by transvaginal scan
Impression

 Patient is here for a second opinion ultrasound.  On your
 office scan performed yesterday, cervical canal appeared
 dilated but did not appear to contain amniotic membrane.
 She does not have symptoms of pelvic pressure or vaginal
 bleeding.  No history of midtrimester pregnancy losses.
 Limited ultrasound study was performed.  Amniotic fluid is
 normal and good fetal activity seen.  Transabdominal
 evaluation given appearance of funneling at the internal os.
 We performed transvaginal ultrasound.  The cervix measures
 3.1 cm, which is within normal limits.  No funneling or cervical
 shortening was seen on transponders pressure.  No evidence
 of fluid or dilation of the cervical canal.
 I reassured the patient of the findings.
Recommendations

 -Follow-up scans as clinically indicated.
 -Patient has ultrasound appointment at your office next week
 to complete fetal anatomical survey.  Transvaginal evaluation
 may be performed for reassurance.
                 JIM

## 2021-03-02 ENCOUNTER — Inpatient Hospital Stay (HOSPITAL_COMMUNITY)
Admission: AD | Admit: 2021-03-02 | Discharge: 2021-03-04 | DRG: 807 | Disposition: A | Discharge status: Home / Self Care | Payer: 59 | Attending: Obstetrics & Gynecology | Admitting: Obstetrics & Gynecology

## 2021-03-02 ENCOUNTER — Encounter (HOSPITAL_COMMUNITY): Payer: Self-pay | Admitting: Obstetrics and Gynecology

## 2021-03-02 ENCOUNTER — Other Ambulatory Visit: Payer: Self-pay

## 2021-03-02 ENCOUNTER — Inpatient Hospital Stay (HOSPITAL_COMMUNITY): Payer: 59 | Admitting: Anesthesiology

## 2021-03-02 DIAGNOSIS — Z23 Encounter for immunization: Secondary | ICD-10-CM | POA: Diagnosis not present

## 2021-03-02 DIAGNOSIS — O26893 Other specified pregnancy related conditions, third trimester: Secondary | ICD-10-CM | POA: Diagnosis present

## 2021-03-02 DIAGNOSIS — Z880 Allergy status to penicillin: Secondary | ICD-10-CM | POA: Diagnosis not present

## 2021-03-02 DIAGNOSIS — Z20822 Contact with and (suspected) exposure to covid-19: Secondary | ICD-10-CM | POA: Diagnosis present

## 2021-03-02 DIAGNOSIS — O99824 Streptococcus B carrier state complicating childbirth: Secondary | ICD-10-CM | POA: Diagnosis present

## 2021-03-02 DIAGNOSIS — Z3490 Encounter for supervision of normal pregnancy, unspecified, unspecified trimester: Secondary | ICD-10-CM

## 2021-03-02 DIAGNOSIS — Z3A39 39 weeks gestation of pregnancy: Secondary | ICD-10-CM

## 2021-03-02 DIAGNOSIS — Z3689 Encounter for other specified antenatal screening: Secondary | ICD-10-CM

## 2021-03-02 LAB — CBC
HCT: 37.7 % (ref 36.0–46.0)
Hemoglobin: 12.5 g/dL (ref 12.0–15.0)
MCH: 30.3 pg (ref 26.0–34.0)
MCHC: 33.2 g/dL (ref 30.0–36.0)
MCV: 91.3 fL (ref 80.0–100.0)
Platelets: 250 10*3/uL (ref 150–400)
RBC: 4.13 MIL/uL (ref 3.87–5.11)
RDW: 13.2 % (ref 11.5–15.5)
WBC: 12.4 10*3/uL — ABNORMAL HIGH (ref 4.0–10.5)
nRBC: 0 % (ref 0.0–0.2)

## 2021-03-02 LAB — TYPE AND SCREEN
ABO/RH(D): A POS
Antibody Screen: NEGATIVE

## 2021-03-02 LAB — RESP PANEL BY RT-PCR (FLU A&B, COVID) ARPGX2
Influenza A by PCR: NEGATIVE
Influenza B by PCR: NEGATIVE
SARS Coronavirus 2 by RT PCR: NEGATIVE

## 2021-03-02 LAB — AMNISURE RUPTURE OF MEMBRANE (ROM) NOT AT ARMC: Amnisure ROM: NEGATIVE

## 2021-03-02 LAB — RPR: RPR Ser Ql: NONREACTIVE

## 2021-03-02 MED ORDER — ONDANSETRON HCL 4 MG/2ML IJ SOLN: 4.0000 mg | INTRAMUSCULAR | Status: DC | PRN

## 2021-03-02 MED ORDER — OXYCODONE-ACETAMINOPHEN 5-325 MG PO TABS: 1.0000 | ORAL_TABLET | ORAL | Status: DC | PRN

## 2021-03-02 MED ORDER — OXYCODONE-ACETAMINOPHEN 5-325 MG PO TABS: 2.0000 | ORAL_TABLET | ORAL | Status: DC | PRN

## 2021-03-02 MED ORDER — CLINDAMYCIN PHOSPHATE 900 MG/50ML IV SOLN
900.0000 mg | Freq: Once | INTRAVENOUS | Status: AC
Start: 2021-03-02 — End: 2021-03-02
  Administered 2021-03-02: 900 mg via INTRAVENOUS
  Filled 2021-03-02: qty 50

## 2021-03-02 MED ORDER — TETANUS-DIPHTH-ACELL PERTUSSIS 5-2.5-18.5 LF-MCG/0.5 IM SUSY: 0.5000 mL | PREFILLED_SYRINGE | Freq: Once | INTRAMUSCULAR | Status: DC

## 2021-03-02 MED ORDER — LACTATED RINGERS IV SOLN: INTRAVENOUS | Status: DC

## 2021-03-02 MED ORDER — ACETAMINOPHEN 325 MG PO TABS: 650.0000 mg | ORAL_TABLET | ORAL | Status: DC | PRN

## 2021-03-02 MED ORDER — ACETAMINOPHEN 325 MG PO TABS
650.0000 mg | ORAL_TABLET | ORAL | Status: DC | PRN
  Administered 2021-03-02 (×2): 650 mg via ORAL
  Filled 2021-03-02 (×2): qty 2

## 2021-03-02 MED ORDER — ZOLPIDEM TARTRATE 5 MG PO TABS: 5.0000 mg | ORAL_TABLET | Freq: Every evening | ORAL | Status: DC | PRN

## 2021-03-02 MED ORDER — BENZOCAINE-MENTHOL 20-0.5 % EX AERO
1.0000 "application " | INHALATION_SPRAY | CUTANEOUS | Status: DC | PRN
  Administered 2021-03-02: 1 via TOPICAL
  Filled 2021-03-02: qty 56

## 2021-03-02 MED ORDER — DIPHENHYDRAMINE HCL 25 MG PO CAPS: 25.0000 mg | ORAL_CAPSULE | Freq: Four times a day (QID) | ORAL | Status: DC | PRN

## 2021-03-02 MED ORDER — OXYTOCIN BOLUS FROM INFUSION
333.0000 mL | Freq: Once | INTRAVENOUS | Status: AC
  Administered 2021-03-02: 333 mL via INTRAVENOUS

## 2021-03-02 MED ORDER — DIBUCAINE (PERIANAL) 1 % EX OINT: 1.0000 "application " | TOPICAL_OINTMENT | CUTANEOUS | Status: DC | PRN

## 2021-03-02 MED ORDER — DIPHENHYDRAMINE HCL 50 MG/ML IJ SOLN: 12.5000 mg | INTRAMUSCULAR | Status: DC | PRN

## 2021-03-02 MED ORDER — EPHEDRINE 5 MG/ML INJ: 10.0000 mg | INTRAVENOUS | Status: DC | PRN

## 2021-03-02 MED ORDER — WITCH HAZEL-GLYCERIN EX PADS: 1.0000 "application " | MEDICATED_PAD | CUTANEOUS | Status: DC | PRN

## 2021-03-02 MED ORDER — ONDANSETRON HCL 4 MG PO TABS: 4.0000 mg | ORAL_TABLET | ORAL | Status: DC | PRN

## 2021-03-02 MED ORDER — SOD CITRATE-CITRIC ACID 500-334 MG/5ML PO SOLN: 30.0000 mL | ORAL | Status: DC | PRN

## 2021-03-02 MED ORDER — FENTANYL-BUPIVACAINE-NACL 0.5-0.125-0.9 MG/250ML-% EP SOLN
12.0000 mL/h | EPIDURAL | Status: DC | PRN
  Administered 2021-03-02: 12 mL/h via EPIDURAL

## 2021-03-02 MED ORDER — LACTATED RINGERS IV SOLN
500.0000 mL | INTRAVENOUS | Status: DC | PRN
Start: 2021-03-02 — End: 2021-03-02

## 2021-03-02 MED ORDER — FENTANYL-BUPIVACAINE-NACL 0.5-0.125-0.9 MG/250ML-% EP SOLN
EPIDURAL | Status: AC
  Filled 2021-03-02: qty 250

## 2021-03-02 MED ORDER — LACTATED RINGERS IV SOLN
500.0000 mL | Freq: Once | INTRAVENOUS | Status: AC
  Administered 2021-03-02: 500 mL via INTRAVENOUS

## 2021-03-02 MED ORDER — SENNOSIDES-DOCUSATE SODIUM 8.6-50 MG PO TABS
2.0000 | ORAL_TABLET | Freq: Every day | ORAL | Status: DC
  Administered 2021-03-03 – 2021-03-04 (×2): 2 via ORAL
  Filled 2021-03-02 (×2): qty 2

## 2021-03-02 MED ORDER — LIDOCAINE HCL (PF) 1 % IJ SOLN
30.0000 mL | INTRAMUSCULAR | Status: AC | PRN
  Administered 2021-03-02: 30 mL via SUBCUTANEOUS
  Filled 2021-03-02: qty 30

## 2021-03-02 MED ORDER — LIDOCAINE HCL (PF) 1 % IJ SOLN
INTRAMUSCULAR | Status: DC | PRN
  Administered 2021-03-02: 6 mL via EPIDURAL

## 2021-03-02 MED ORDER — PHENYLEPHRINE 40 MCG/ML (10ML) SYRINGE FOR IV PUSH (FOR BLOOD PRESSURE SUPPORT): 80.0000 ug | PREFILLED_SYRINGE | INTRAVENOUS | Status: DC | PRN

## 2021-03-02 MED ORDER — ONDANSETRON HCL 4 MG/2ML IJ SOLN: 4.0000 mg | Freq: Four times a day (QID) | INTRAMUSCULAR | Status: DC | PRN

## 2021-03-02 MED ORDER — FLEET ENEMA 7-19 GM/118ML RE ENEM: 1.0000 | ENEMA | RECTAL | Status: DC | PRN

## 2021-03-02 MED ORDER — OXYTOCIN-SODIUM CHLORIDE 30-0.9 UT/500ML-% IV SOLN
2.5000 [IU]/h | INTRAVENOUS | Status: DC
  Administered 2021-03-02: 2.5 [IU]/h via INTRAVENOUS
  Filled 2021-03-02: qty 500

## 2021-03-02 MED ORDER — OXYCODONE-ACETAMINOPHEN 5-325 MG PO TABS
2.0000 | ORAL_TABLET | ORAL | Status: DC | PRN
Start: 2021-03-02 — End: 2021-03-02

## 2021-03-02 MED ORDER — PRENATAL MULTIVITAMIN CH
1.0000 | ORAL_TABLET | Freq: Every day | ORAL | Status: DC
  Administered 2021-03-03 – 2021-03-04 (×2): 1 via ORAL
  Filled 2021-03-02 (×2): qty 1

## 2021-03-02 MED ORDER — SIMETHICONE 80 MG PO CHEW: 80.0000 mg | CHEWABLE_TABLET | ORAL | Status: DC | PRN

## 2021-03-02 MED ORDER — COCONUT OIL OIL
1.0000 "application " | TOPICAL_OIL | Status: DC | PRN
  Administered 2021-03-03: 1 via TOPICAL

## 2021-03-02 MED ORDER — IBUPROFEN 600 MG PO TABS
600.0000 mg | ORAL_TABLET | Freq: Four times a day (QID) | ORAL | Status: DC
  Administered 2021-03-02 – 2021-03-04 (×7): 600 mg via ORAL
  Filled 2021-03-02 (×7): qty 1

## 2021-03-02 NOTE — Anesthesia Preprocedure Evaluation (Signed)
Anesthesia Evaluation  Patient identified by MRN, date of birth, ID band Patient awake    Reviewed: Allergy & Precautions, NPO status , Patient's Chart, lab work & pertinent test results  Airway Mallampati: II  TM Distance: >3 FB Neck ROM: Full    Dental no notable dental hx.    Pulmonary neg pulmonary ROS,    Pulmonary exam normal breath sounds clear to auscultation       Cardiovascular negative cardio ROS Normal cardiovascular exam Rhythm:Regular Rate:Normal     Neuro/Psych negative neurological ROS  negative psych ROS   GI/Hepatic negative GI ROS, Neg liver ROS,   Endo/Other  negative endocrine ROS  Renal/GU negative Renal ROS  negative genitourinary   Musculoskeletal negative musculoskeletal ROS (+)   Abdominal   Peds negative pediatric ROS (+)  Hematology negative hematology ROS (+)   Anesthesia Other Findings   Reproductive/Obstetrics (+) Pregnancy                             Anesthesia Physical Anesthesia Plan  ASA: 2  Anesthesia Plan: Epidural   Post-op Pain Management:    Induction:   PONV Risk Score and Plan: 2 and Treatment may vary due to age or medical condition  Airway Management Planned: Natural Airway  Additional Equipment: None  Intra-op Plan:   Post-operative Plan:   Informed Consent: I have reviewed the patients History and Physical, chart, labs and discussed the procedure including the risks, benefits and alternatives for the proposed anesthesia with the patient or authorized representative who has indicated his/her understanding and acceptance.       Plan Discussed with: Anesthesiologist  Anesthesia Plan Comments:         Anesthesia Quick Evaluation  

## 2021-03-02 NOTE — Lactation Note (Signed)
This note was copied from a baby's chart. Lactation Consultation Note  Patient Name: Janet Edwards [id number] Date: 03/02/2021 Reason for consult: Initial assessment;Term;Primapara;1st time breastfeeding Age:26 hours  Initial visit to 9 hours old infant of a P1 mother. Infant is cueing upon arrival. [facility] offered assistance with latch. LC demonstrated alignment, support pillows, and hand expression. Infant latches after a few attempts. Discussed normal newborn behavior and patterns, signs of good milk transfer, hunger cues, tummy size and benefits of skin to skin.  Baby is still breastfeeding upon LC leaving room.   Plan: 1-Deep, comfortable latch and breastfeeding on demand or 8-12 times in 24h period. 2-Encouraged maternal rest, hydration and food intake.  3-Contact LC as needed for feeds/support/concerns/questions   All questions answered at this time. Provided Lactation services brochure and promoted [employer] booklet information.     Maternal Data Has patient been taught Hand Expression?: Yes Does the patient have breastfeeding experience prior to this delivery?: No  Feeding Mother's Current Feeding Choice: Breast Milk  LATCH Score Latch: Grasps breast easily, tongue down, lips flanged, rhythmical sucking.  Audible Swallowing: Spontaneous and intermittent  Type of Nipple: Everted at rest and after stimulation (short shafted)  Comfort (Breast/Nipple): Soft / non-tender  Hold (Positioning): Assistance needed to correctly position infant at breast and maintain latch.  LATCH Score: 9   Interventions Interventions: Breast feeding basics reviewed;Assisted with latch;Skin to skin;Breast massage;Hand express;Breast compression;Adjust position;Expressed milk;Position options;Support pillows;Education;LC Services brochure  Discharge Pump: Personal WIC Program: No  Consult Status Consult Status: Follow-up Date: 03/03/21 Follow-up type: In-patient    Decklyn Hornik A Higuera  Ancidey 03/02/2021, 6:21 PM

## 2021-03-02 NOTE — Anesthesia Procedure Notes (Signed)
Epidural Patient location during procedure: OB Start time: 03/02/2021 5:10 AM End time: 03/02/2021 5:20 AM  Staffing Anesthesiologist: Mellody Dance, MD Performed: anesthesiologist   Preanesthetic Checklist Completed: patient identified, IV checked, site marked, risks and benefits discussed, monitors and equipment checked, pre-op evaluation and timeout performed  Epidural Patient position: sitting Prep: DuraPrep Patient monitoring: heart rate, cardiac monitor, continuous pulse ox and blood pressure Approach: midline Location: L2-L3 Injection technique: LOR saline  Needle:  Needle type: Tuohy  Needle gauge: 17 G Needle length: 9 cm Needle insertion depth: 6 cm Catheter type: closed end flexible Catheter size: 20 Guage Catheter at skin depth: 11 cm Test dose: negative and Other  Assessment Events: blood not aspirated, injection not painful, no injection resistance and negative IV test  Additional Notes Informed consent obtained prior to proceeding including risk of failure, 1% risk of PDPH, risk of minor discomfort and bruising.  Discussed rare but serious complications including epidural abscess, permanent nerve injury, epidural hematoma.  Discussed alternatives to epidural analgesia and patient desires to proceed.  Timeout performed pre-procedure verifying patient name, procedure, and platelet count.  Patient tolerated procedure well.

## 2021-03-02 NOTE — MAU Note (Signed)
Pt reports ctx pain since 1am today, 10/10scale, denies any vaginal bleeding but unsure about leakage of fluid, + FM, support person at bedside.

## 2021-03-02 NOTE — Lactation Note (Signed)
This note was copied from a baby's chart. Lactation Consultation Note  Patient Name: Janet Edwards Date: 03/02/2021 Reason for consult: L&D Initial assessment Age:26 hours P1, Infant crying while lying at the breast in cradle hold. Mother reports that infant has not latched.  Assist with hand expression. Infant latched on and off with a few sucks. Infant moved to football hold with pillow support. Infant latch on for more burst of sucks and swallows on and off several times over 15 mins.  Infant cried out  and pulled away from the breast multiple times.  Informed mother to continue to do frequent STS . Mother informed that she would be seen when transfer to the floor for more assistance by RN/LC staff.    Maternal Data    Feeding Mother's Current Feeding Choice: Breast Milk  LATCH Score Latch: Repeated attempts needed to sustain latch, nipple held in mouth throughout feeding, stimulation needed to elicit sucking reflex.  Audible Swallowing: A few with stimulation  Type of Nipple: Everted at rest and after stimulation  Comfort (Breast/Nipple): Soft / non-tender  Hold (Positioning): Full assist, staff holds infant at breast  LATCH Score: 6   Lactation Tools Discussed/Used    Interventions Interventions: Breast feeding basics reviewed;Assisted with latch;Skin to skin;Hand express;Adjust position;Support pillows;Education  Discharge    Consult Status Consult Status: Follow-up from L&D    Stevan Born [facility] 03/02/2021, 10:06 AM

## 2021-03-02 NOTE — Anesthesia Postprocedure Evaluation (Signed)
Anesthesia Post Note  Patient: Janet Edwards  Procedure(s) Performed: AN AD HOC LABOR EPIDURAL     Patient location during evaluation: Mother Baby Anesthesia Type: Epidural Level of consciousness: awake and alert Pain management: pain level controlled Vital Signs Assessment: post-procedure vital signs reviewed and stable Respiratory status: spontaneous breathing, nonlabored ventilation and respiratory function stable Cardiovascular status: stable Postop Assessment: no headache, no backache and epidural receding Anesthetic complications: no   No notable events documented.  Last Vitals:  Vitals:   03/02/21 1108 03/02/21 1205  BP: 125/64 123/70  Pulse: 92 93  Resp: 18 18  Temp: 36.9 C 37.1 C  SpO2: 100% 100%    Last Pain:  Vitals:   03/02/21 1250  TempSrc:   PainSc: 4    Pain Goal:                   [name]

## 2021-03-02 NOTE — MAU Provider Note (Signed)
S: Ms. Janet Edwards is a 25 y.o. G2P0010 at [redacted]w[redacted]d  who presents to MAU today for labor evaluation and evaluation of possible ruptured membranes  Cervical exam by RN:    5-6cm per RN   Fetal Monitoring: Baseline: 140 Variability: average Accelerations: present Decelerations: absent Contractions: q43min  Results for orders placed or performed during the hospital encounter of 03/02/21 (from the past 24 hour(s))  Amnisure rupture of membrane (rom)not at [facility]     Status: None   Collection Time: 03/02/21  4:02 AM  Result Value Ref Range   Amnisure ROM NEGATIVE     MDM Discussed patient with RN. NST reviewed.   A: [facility] at [redacted]w[redacted]d  Active labor  P: Admit per Dr Vincente Poli MD to follow  Aviva Signs, CNM 03/02/2021 4:31 AM

## 2021-03-02 NOTE — H&P (Signed)
Janet Edwards is a 25 y.o. female G2P0 at [redacted]w[redacted]d presenting for active labor.  Patient has progressed quickly and is comfortable with CLEA.  Antepartum course complicated by asthma; stable on no medication.  GBS positive and PCN allergy; sensitive to Clinda.    OB History     Gravida  2   Para  0   Term  0   Preterm  0   AB  1   Living         SAB  1   IAB  0   Ectopic  0   Multiple      Live Births             Past Medical History:  Diagnosis Date   Asthma    IBS (irritable bowel syndrome)    Past Surgical History:  Procedure Laterality Date   WISDOM TOOTH EXTRACTION     Family History: family history includes Atrial fibrillation in her paternal grandfather; Cancer in her maternal grandfather. Social History:  reports that she has never smoked. She has never used smokeless tobacco. She reports that she does not currently use alcohol. She reports that she does not use drugs.     Maternal Diabetes: No Genetic Screening: Normal Maternal Ultrasounds/Referrals: Normal Fetal Ultrasounds or other Referrals:  None Maternal Substance Abuse:  No Significant Maternal Medications:  None Significant Maternal Lab Results:  Group B Strep positive Other Comments:  None  Review of Systems Maternal Medical History:  Reason for admission: Contractions.   Contractions: Onset was 6-12 hours ago.   Frequency: regular.   Perceived severity is moderate.   Fetal activity: Perceived fetal activity is normal.   Last perceived fetal movement was within the past hour.   Prenatal complications: no prenatal complications Prenatal Complications - Diabetes: none.  Dilation: 10 Effacement (%): 100 Station: Plus 1 Exam by:: Dr. Ranell Patrick Blood pressure 116/69, pulse 90, temperature 98 F (36.7 C), temperature source Oral, resp. rate 18, height 5\' 3"  (1.6 m), weight 91.2 kg, last menstrual period 06/01/2020, SpO2 95 %. Maternal Exam:  Uterine Assessment: Contraction strength is  moderate.  Contraction frequency is regular.  Abdomen: Patient reports no abdominal tenderness. Fundal height is c/w dates.   Estimated fetal weight is 7#6.   Fetal presentation: vertex Introitus: Normal vulva. Amniotic fluid character: clear. Pelvis: adequate for delivery.   Cervix: Cervix evaluated by digital exam.     Fetal Exam Fetal Monitor Review: Baseline rate: 135.  Variability: moderate (6-25 bpm).   Pattern: accelerations present and variable decelerations.   Fetal State Assessment: Category I - tracings are normal.  Physical Exam Constitutional:      Appearance: Normal appearance.  HENT:     Head: Normocephalic and atraumatic.  Pulmonary:     Effort: Pulmonary effort is normal.  Abdominal:     Palpations: Abdomen is soft.  Genitourinary:    General: Normal vulva.  Musculoskeletal:        General: Normal range of motion.     Cervical back: Normal range of motion.  Skin:    General: Skin is warm and dry.  Neurological:     General: No focal deficit present.     Mental Status: She is alert and oriented to person, place, and time.  Psychiatric:        Mood and Affect: Mood normal.        Behavior: Behavior normal.    Prenatal labs: ABO, Rh: --/--/A POS (10/13 0416) Antibody: NEG (10/13 0416) Rubella:  Non-immune RPR:   NR HBsAg:   Negative HIV:   NR GBS:   Positive  Assessment/Plan: 24yo G2P0 at [redacted]w[redacted]d with labor -S/P AROM and complete; will start pushing -Clinda for GBS ppx -Anticipate NSVD   Mitchel Honour 03/02/2021, 8:22 AM

## 2021-03-03 LAB — CBC
HCT: 31.3 % — ABNORMAL LOW (ref 36.0–46.0)
Hemoglobin: 10.5 g/dL — ABNORMAL LOW (ref 12.0–15.0)
MCH: 30.9 pg (ref 26.0–34.0)
MCHC: 33.5 g/dL (ref 30.0–36.0)
MCV: 92.1 fL (ref 80.0–100.0)
Platelets: 182 10*3/uL (ref 150–400)
RBC: 3.4 MIL/uL — ABNORMAL LOW (ref 3.87–5.11)
RDW: 13.6 % (ref 11.5–15.5)
WBC: 11.4 10*3/uL — ABNORMAL HIGH (ref 4.0–10.5)
nRBC: 0 % (ref 0.0–0.2)

## 2021-03-03 NOTE — Lactation Note (Signed)
This note was copied from a baby's chart. Lactation Consultation Note  Patient Name: Janet Edwards [id number] Date: 03/03/2021 Reason for consult: Follow-up assessment;Mother's request;Difficult latch;1st time breastfeeding;Term Age:26 hours Mom requested assistance with latch. Infant is currently cluster feeding. LC asked mom to hand express a small amount of colostrum prior to latching infant at the breast.  Mom latched infant on her right breast using the cross cradle hold, infant latched with depth and sustained latch, was still breastfeeding after 5 minutes when LC left the room. Mom knows infant is cluster feeding, breastfeed infant according to feeding cues, 8 to 12+ times or more within 24 hours. Mom knows to call RN/LC if she has breastfeeding questions, concerns or needs assistance with latching infant at the breast. Maternal Data    Feeding Mother's Current Feeding Choice: Breast Milk  LATCH Score Latch: Grasps breast easily, tongue down, lips flanged, rhythmical sucking.  Audible Swallowing: Spontaneous and intermittent  Type of Nipple: Everted at rest and after stimulation  Comfort (Breast/Nipple): Filling, red/small blisters or bruises, mild/mod discomfort  Hold (Positioning): Assistance needed to correctly position infant at breast and maintain latch.  LATCH Score: 8   Lactation Tools Discussed/Used    Interventions Interventions: Skin to skin;Breast massage;Hand express;Breast compression;Adjust position;Support pillows;Position options;Expressed milk;Assisted with latch  Discharge    Consult Status      Danelle Earthly 03/03/2021, 9:51 PM

## 2021-03-03 NOTE — Progress Notes (Signed)
Post Partum Day 1 Subjective: no complaints, up ad lib, voiding, tolerating PO, and + flatus  Objective: Blood pressure 121/69, pulse 83, temperature 98.3 F (36.8 C), temperature source Oral, resp. rate 18, height 5\' 3"  (1.6 m), weight 91.2 kg, last menstrual period 06/01/2020, SpO2 100 %, unknown if currently breastfeeding.  Physical Exam:  General: alert, cooperative, and no distress Lochia: appropriate Uterine Fundus: firm Incision: healing well DVT Evaluation: No evidence of DVT seen on physical exam.  Recent Labs    03/02/21 0420 03/03/21 0442  HGB 12.5 10.5*  HCT 37.7 31.3*    Assessment/Plan: Plan for discharge tomorrow   LOS: 1 day   03/05/21 II 03/03/2021, 7:16 AM

## 2021-03-03 NOTE — Lactation Note (Signed)
This note was copied from a baby's chart. Lactation Consultation Note  Patient Name: Janet Edwards Date: 03/03/2021  Age:26 hours P1, term female infant with -4% weight loss. LC entered room, mom was eating dinner. Per mom, infant recently latched and breastfeed for 20 minutes. LC did not observe latch. Per om, she feels breastfeeding is going well she doesn't have any questions or concerns for LC at this time.  Maternal Data    Feeding    LATCH Score                    Lactation Tools Discussed/Used    Interventions    Discharge    Consult Status      Janet Edwards 03/03/2021, 6:06 PM

## 2021-03-04 MED ORDER — MEASLES, MUMPS & RUBELLA VAC IJ SOLR
0.5000 mL | Freq: Once | INTRAMUSCULAR | Status: AC
  Administered 2021-03-04: 0.5 mL via SUBCUTANEOUS
  Filled 2021-03-04: qty 0.5

## 2021-03-04 MED ORDER — IBUPROFEN 600 MG PO TABS: 600.0000 mg | ORAL_TABLET | Freq: Four times a day (QID) | ORAL | 0 refills | Status: DC | PRN

## 2021-03-04 MED ORDER — ACETAMINOPHEN 325 MG PO TABS: 650.0000 mg | ORAL_TABLET | ORAL | 0 refills | Status: DC | PRN

## 2021-03-04 NOTE — Lactation Note (Signed)
This note was copied from a baby's chart. Lactation Consultation Note  Patient Name: Janet Edwards [id number] Date: 03/04/2021 Reason for consult: Follow-up assessment Age:26 hours   P1 mother whose infant is now 52 hours old.  This is a term baby at 39+1 weeks.  Baby was asleep at mother's side when I arrived.  Mother had just completed a 15 minutes feeding session.  She had no questions/concerns related to breast feeding. Mother will continue to feed 8-12 times/24 hours or sooner if baby shows cues.  Mother has a bruise to her left areola from a previous poor latch and nipples are slightly irritated.  Mother has been using coconut oil with relief.  Mother has a DEBP for home use and our OP phone number for any concerns after discharge.  Father present.  Family awaiting discharge.   Maternal Data    Feeding    LATCH Score                    Lactation Tools Discussed/Used    Interventions Interventions: Education  Discharge Discharge Education: Engorgement and breast care Pump: Personal  Consult Status Consult Status: Complete Date: 03/04/21 Follow-up type: In-patient    Nicola Quesnell R Nahuel Wilbert 03/04/2021, 9:53 AM

## 2021-03-04 NOTE — Discharge Summary (Signed)
Postpartum Discharge Summary  Date of Service updated 03/04/21     Patient Name: Janet Edwards DOB: 26-May-1995 MRN: [id number]  Date of admission: 03/02/2021 Delivery date:03/02/2021  Delivering provider: Linda Hedges  Date of discharge: 03/04/2021  Admitting diagnosis: Normal labor [O80, Z37.9] Pregnancy [Z34.90] Intrauterine pregnancy: [redacted]w[redacted]d     Secondary diagnosis:  Active Problems:   Normal labor   Pregnancy  Additional problems:     Discharge diagnosis: Term Pregnancy Delivered                                              Post partum procedures: Augmentation:  Complications: None  Hospital course: Onset of Labor With Vaginal Delivery      25 y.o. yo G2P1011 at [redacted]w[redacted]d was admitted in Active Labor on 03/02/2021. Patient had an uncomplicated labor course as follows:  Membrane Rupture Time/Date: 7:51 AM ,03/02/2021   Delivery Method:Vaginal, Spontaneous  Episiotomy: None  Lacerations:  2nd degree  Patient had an uncomplicated postpartum course.  She is ambulating, tolerating a regular diet, passing flatus, and urinating well. Patient is discharged home in stable condition on 03/04/21.  Newborn Data: Birth date:03/02/2021  Birth time:8:53 AM  Gender:Female  Living status:Living  Apgars:8 ,9  Weight:3535 g   Magnesium Sulfate received: No BMZ received: No Rhophylac:No MMR:No T-DaP:Given prenatally Flu: No Transfusion:No  Physical exam  Vitals:   03/03/21 0628 03/03/21 0916 03/03/21 2007 03/04/21 0434  BP: 121/69 117/66 111/69 114/70  Pulse: 83 82 96 79  Resp: $Remo'18 18 18 18  'VeTeD$ Temp: 98.3 F (36.8 C) 98.4 F (36.9 C) 98.3 F (36.8 C) 98.3 F (36.8 C)  TempSrc: Oral Oral Oral Oral  SpO2:  100% 95%   Weight:      Height:       General: alert, cooperative, and no distress Lochia: appropriate Uterine Fundus: firm Incision: Healing well with no significant drainage DVT Evaluation: No evidence of DVT seen on physical exam. Labs: Lab Results   Component Value Date   WBC 11.4 (H) 03/03/2021   HGB 10.5 (L) 03/03/2021   HCT 31.3 (L) 03/03/2021   MCV 92.1 03/03/2021   PLT 182 03/03/2021   CMP Latest Ref Rng & Units 07/15/2020  Glucose 70 - 99 mg/dL 92  BUN 6 - 20 mg/dL 10  Creatinine 0.44 - 1.00 mg/dL 0.69  Sodium 135 - 145 mmol/L 138  Potassium 3.5 - 5.1 mmol/L 4.2  Chloride 98 - 111 mmol/L 103  CO2 22 - 32 mmol/L 16(L)  Calcium 8.9 - 10.3 mg/dL 9.4   Edinburgh Score: Edinburgh Postnatal Depression Scale Screening Tool 03/02/2021  I have been able to laugh and see the funny side of things. (No Data)      After visit meds:  Allergies as of 03/04/2021       Reactions   Penicillins    Mother told her she was unsure of what reaction    Pneumococcal Vaccine Other (See Comments)   Hematoma with mild to moderate localized reaction   Vicks Dayquil Cough [dextromethorphan Hbr] Nausea And Vomiting   Per pt; states she had n/v two different times after taking it         Medication List     STOP taking these medications    metoCLOPramide 10 MG tablet Commonly known as: REGLAN   ondansetron 4 MG disintegrating tablet Commonly known  as: Zofran ODT   promethazine 12.5 MG tablet Commonly known as: PHENERGAN       TAKE these medications    acetaminophen 325 MG tablet Commonly known as: Tylenol Take 2 tablets (650 mg total) by mouth every 4 (four) hours as needed (for pain scale < 4).   ibuprofen 600 MG tablet Commonly known as: ADVIL Take 1 tablet (600 mg total) by mouth every 6 (six) hours as needed.   PrePLUS 27-1 MG Tabs Take 1 tablet by mouth daily.         Discharge home in stable condition Infant Feeding: Breast Infant Disposition:home with mother Discharge instruction: per After Visit Summary and Postpartum booklet. Activity: Advance as tolerated. Pelvic rest for 6 weeks.  Diet: routine diet Anticipated Birth Control: Unsure Postpartum Appointment:6 weeks Additional Postpartum F/U:   Future Appointments:No future appointments. Follow up Visit:      03/04/2021 Allena Katz, MD

## 2021-03-08 ENCOUNTER — Inpatient Hospital Stay (HOSPITAL_COMMUNITY): Admit: 2021-03-08 | Payer: Self-pay

## 2021-03-14 ENCOUNTER — Telehealth (HOSPITAL_COMMUNITY): Payer: Self-pay | Admitting: *Deleted

## 2021-03-14 NOTE — Telephone Encounter (Signed)
Attempted hospital discharge follow-up call. Left message for patient to return RN call. Deforest Hoyles, RN, 03/14/21, [phone]

## 2022-01-12 LAB — OB RESULTS CONSOLE ABO/RH: RH Type: POSITIVE

## 2022-01-12 LAB — OB RESULTS CONSOLE HIV ANTIBODY (ROUTINE TESTING): HIV: NONREACTIVE

## 2022-01-12 LAB — OB RESULTS CONSOLE GC/CHLAMYDIA
Chlamydia: NEGATIVE
Neisseria Gonorrhea: NEGATIVE

## 2022-01-12 LAB — OB RESULTS CONSOLE RUBELLA ANTIBODY, IGM: Rubella: IMMUNE

## 2022-01-12 LAB — OB RESULTS CONSOLE ANTIBODY SCREEN: Antibody Screen: NEGATIVE

## 2022-01-12 LAB — OB RESULTS CONSOLE VARICELLA ZOSTER ANTIBODY, IGG: Varicella: IMMUNE

## 2022-01-12 LAB — OB RESULTS CONSOLE HEPATITIS B SURFACE ANTIGEN: Hepatitis B Surface Ag: NEGATIVE

## 2022-01-12 LAB — HEPATITIS C ANTIBODY: HCV Ab: NEGATIVE

## 2022-01-12 LAB — OB RESULTS CONSOLE RPR: RPR: NONREACTIVE

## 2022-05-21 NOTE — L&D Delivery Note (Signed)
   Delivery Note:   G3P1011 at [redacted]w[redacted]d Admitting diagnosis: Normal labor [O80, Z37.9] Risks: None Onset of labor: 07/20/2022 at 2200 IOL/Augmentation: AROM ROM: 07/21/2022 at 0445, clear fluid  In waterbirth tub at 0449, temp 100.70F  Complete dilation at 07/21/2022  0622 Onset of pushing at 0622 FHR second stage reassuring via Doppler  Analgesia/Anesthesia intrapartum:Local  Pushing in hands and knees position in waterbirth tub with CNM and L&D staff support. Husband, Janet Edwards, and mother, Janet Edwards present for birth and supportive.  Delivery of a Live born female  Birth Weight: 8 lb 14.9 oz (4050 g) APGAR: 6, 9  Newborn Delivery   Birth date/time: 07/21/2022 06:24:00 Delivery type: Vaginal, Spontaneous     in cephalic presentation, position OA to ROA.  APGAR:1 min-6 , 5 min-9   Nuchal Cord: No  Cord double clamped after cessation of pulsation, cut by Janet Edwards.  Collection of cord blood for typing completed. Arterial cord blood sample-No    Placenta delivered-Spontaneous  with 3 vessels . Uterotonics: None Placenta to L&D Uterine tone firm  Bleeding scant  1st degree  laceration identified.  Episiotomy:None  Local analgesia: Lidocaine  Repair: 3-0 in usual fashion with excellent approximation Est. Blood Loss ([id number]  Complications: None  Mom to postpartum. [facility]  Delivery Report:   Review the Delivery Report for details.    ASuzan Nailer CNM, MSN 07/21/2022, 7:01 AM

## 2022-05-27 ENCOUNTER — Ambulatory Visit
Admission: EM | Admit: 2022-05-27 | Discharge: 2022-05-27 | Disposition: A | Discharge status: Home / Self Care | Payer: 59 | Attending: Emergency Medicine | Admitting: Emergency Medicine

## 2022-05-27 DIAGNOSIS — N3000 Acute cystitis without hematuria: Secondary | ICD-10-CM | POA: Diagnosis not present

## 2022-05-27 LAB — URINALYSIS, MICROSCOPIC (REFLEX): RBC / HPF: >50 RBC/hpf (ref 0–5)

## 2022-05-27 LAB — URINALYSIS, ROUTINE W REFLEX MICROSCOPIC
Glucose, UA: NEGATIVE mg/dL
Nitrite: NEGATIVE
Specific Gravity, Urine: 1.025 (ref 1.005–1.030)
pH: 6.5 (ref 5.0–8.0)

## 2022-05-27 MED ORDER — CEPHALEXIN 500 MG PO CAPS: 500.0000 mg | ORAL_CAPSULE | Freq: Two times a day (BID) | ORAL | 0 refills | Status: AC

## 2022-05-27 NOTE — ED Provider Notes (Signed)
[facility]    CSN: [id number] Arrival date & time: 05/27/22  1356      History   Chief Complaint Chief Complaint  Patient presents with   Urinary Tract Infection    HPI Janet Edwards is a 27 y.o. female.   Patient presents for evaluation of urinary frequency, urgency, dysuria and urinary odor beginning 2 days ago.  Has not attempted treatment of symptoms.  Currently [redacted] weeks pregnant.  Denies hematuria, lower abdominal pain or pressure, flank pain, fevers, vaginal discharge, itching or odor.  No concern for STI.   Past Medical History:  Diagnosis Date   Asthma    IBS (irritable bowel syndrome)     Patient Active Problem List   Diagnosis Date Noted   Normal labor 03/02/2021   Pregnancy 03/02/2021   IBS (irritable bowel syndrome) 02/04/2015   Asthma 02/04/2015    Past Surgical History:  Procedure Laterality Date   WISDOM TOOTH EXTRACTION      OB History     Gravida  3   Para  1   Term  1   Preterm  0   AB  1   Living  1      SAB  1   IAB  0   Ectopic  0   Multiple  0   Live Births  1            Home Medications    Prior to Admission medications   Medication Sig Start Date End Date Taking? Authorizing Provider  acetaminophen (TYLENOL) 325 MG tablet Take 2 tablets (650 mg total) by mouth every 4 (four) hours as needed (for pain scale < 4). 03/04/21  Yes Harold Hedge, MD  ibuprofen (ADVIL) 600 MG tablet Take 1 tablet (600 mg total) by mouth every 6 (six) hours as needed. 03/04/21  Yes Harold Hedge, MD  pantoprazole (PROTONIX) 40 MG tablet Take 1 tablet by mouth daily. 11/30/21  Yes [provider]  Prenatal Vit-Fe Fumarate-FA (PREPLUS) 27-1 MG TABS Take 1 tablet by mouth daily.   Yes [provider]  ondansetron (ZOFRAN) 4 MG tablet TAKE 1 TABLET BY MOUTH EVERY 8 HOURS AS NEEDED FOR NAUSEA FOR UP TO 7 DAYS    [provider]    Family History Family History  Problem Relation Age of Onset    Cancer Maternal Grandfather        prostate cancer   Atrial fibrillation Paternal Grandfather     Social History Social History   Tobacco Use   Smoking status: Never   Smokeless tobacco: Never  Vaping Use   Vaping Use: Never used  Substance Use Topics   Alcohol use: Not Currently    Comment: socially    Drug use: Never     Allergies   Penicillins, Pneumococcal vaccine, and Vicks dayquil cough [dextromethorphan hbr]   Review of Systems Review of Systems  Constitutional: Negative.   Respiratory: Negative.    Cardiovascular: Negative.   Genitourinary:  Positive for dysuria, frequency and urgency. Negative for decreased urine volume, difficulty urinating, dyspareunia, enuresis, flank pain, genital sores, hematuria, menstrual problem, pelvic pain, vaginal bleeding, vaginal discharge and vaginal pain.  Skin: Negative.      Physical Exam Triage Vital Signs ED Triage Vitals  Enc Vitals Group     BP 05/27/22 1447 109/76     Pulse Rate 05/27/22 1447 99     Resp 05/27/22 1447 18     Temp 05/27/22 1447 98.5 F (36.9 C)  Temp Source 05/27/22 1447 Oral     SpO2 05/27/22 1447 97 %     Weight 05/27/22 1446 196 lb (88.9 kg)     Height 05/27/22 1446 5\' 4"  (1.626 m)     Head Circumference --      Peak Flow --      Pain Score 05/27/22 1445 2     Pain Loc --      Pain Edu? --      Excl. in [place]? --    No data found.  Updated Vital Signs BP 109/76 (BP Location: Left Arm)   Pulse 99   Temp 98.5 F (36.9 C) (Oral)   Resp 18   Ht 5\' 4"  (1.626 m)   Wt 196 lb (88.9 kg)   SpO2 97%   BMI 33.64 kg/m   Visual Acuity Right Eye Distance:   Left Eye Distance:   Bilateral Distance:    Right Eye Near:   Left Eye Near:    Bilateral Near:     Physical Exam Constitutional:      Appearance: Normal appearance.  Eyes:     Extraocular Movements: Extraocular movements intact.  Pulmonary:     Effort: Pulmonary effort is normal.  Genitourinary:    Comments:  deferred Neurological:     Mental Status: She is alert and oriented to person, place, and time. Mental status is at baseline.      UC Treatments / Results  Labs (all labs ordered are listed, but only abnormal results are displayed) Labs Reviewed  URINALYSIS, ROUTINE W REFLEX MICROSCOPIC - Abnormal; Notable for the following components:      Result Value   APPearance HAZY (*)    Hgb urine dipstick MODERATE (*)    Bilirubin Urine SMALL (*)    Ketones, ur TRACE (*)    Protein, ur TRACE (*)    Leukocytes,Ua SMALL (*)    All other components within normal limits  URINALYSIS, MICROSCOPIC (REFLEX) - Abnormal; Notable for the following components:   Bacteria, UA FEW (*)    All other components within normal limits  URINE CULTURE    EKG   Radiology No results found.  Procedures Procedures (including critical care time)  Medications Ordered in UC Medications - No data to display  Initial Impression / Assessment and Plan / UC Course  I have reviewed the triage vital signs and the nursing notes.  Pertinent labs & imaging results that were available during my care of the patient were reviewed by me and considered in my medical decision making (see chart for details).  Acute cystitis without hematuria  Urinalysis showing leukocytes and bacteria under microscope, negative for nitrates, discussed findings, as patient is pregnant and symptomatic we will prophylactically provide treatment, Keflex prescribed, urine sent for culture, advised increase fluid intake, Tylenol and good hygiene for additional supportive measures, may follow-up with urgent care or gynecologist if symptoms persist or worsen Final Clinical Impressions(s) / UC Diagnoses   Final diagnoses:  None   Discharge Instructions   None    ED Prescriptions   None    PDMP not reviewed this encounter.   Hans Eden, NP 05/27/22 1530

## 2022-05-27 NOTE — Discharge Instructions (Addendum)
Your urinalysis shows Cruze Zingaro blood cells but did not show nitrates which are indicative of infection, your urine will be sent to the lab to determine exactly which bacteria is present, if any changes need to be made to your medications you will be notified  Begin use of Keflex every morning and every evening for 5 days Increase your fluid intake through use of water  As always practice good hygiene, wiping front to back and avoidance of scented vaginal products to prevent further irritation  If symptoms continue to persist after use of medication or recur please follow-up with urgent care or your primary doctor as needed

## 2022-05-27 NOTE — ED Triage Notes (Signed)
Pt c/o possible UTI.  Pt has had urinary odor, urinary pain, and urinary frequency x2days

## 2022-05-28 LAB — URINE CULTURE: Culture: <10000 — AB

## 2022-06-29 LAB — OB RESULTS CONSOLE GBS: GBS: NEGATIVE

## 2022-07-21 ENCOUNTER — Inpatient Hospital Stay (HOSPITAL_COMMUNITY)
Admission: AD | Admit: 2022-07-21 | Discharge: 2022-07-22 | DRG: 807 | Disposition: A | Discharge status: Home / Self Care | Payer: 59 | Attending: Obstetrics and Gynecology | Admitting: Obstetrics and Gynecology

## 2022-07-21 ENCOUNTER — Other Ambulatory Visit: Payer: Self-pay

## 2022-07-21 ENCOUNTER — Encounter (HOSPITAL_COMMUNITY): Payer: Self-pay | Admitting: *Deleted

## 2022-07-21 DIAGNOSIS — O26893 Other specified pregnancy related conditions, third trimester: Secondary | ICD-10-CM | POA: Diagnosis present

## 2022-07-21 DIAGNOSIS — O9962 Diseases of the digestive system complicating childbirth: Secondary | ICD-10-CM | POA: Diagnosis present

## 2022-07-21 DIAGNOSIS — Z3A38 38 weeks gestation of pregnancy: Secondary | ICD-10-CM

## 2022-07-21 DIAGNOSIS — K219 Gastro-esophageal reflux disease without esophagitis: Secondary | ICD-10-CM | POA: Diagnosis present

## 2022-07-21 LAB — CBC
HCT: 33.9 % — ABNORMAL LOW (ref 36.0–46.0)
Hemoglobin: 11.2 g/dL — ABNORMAL LOW (ref 12.0–15.0)
MCH: 27.7 pg (ref 26.0–34.0)
MCHC: 33 g/dL (ref 30.0–36.0)
MCV: 83.9 fL (ref 80.0–100.0)
Platelets: 233 10*3/uL (ref 150–400)
RBC: 4.04 MIL/uL (ref 3.87–5.11)
RDW: 13.4 % (ref 11.5–15.5)
WBC: 10.1 10*3/uL (ref 4.0–10.5)
nRBC: 0 % (ref 0.0–0.2)

## 2022-07-21 LAB — RPR: RPR Ser Ql: NONREACTIVE

## 2022-07-21 LAB — TYPE AND SCREEN
ABO/RH(D): A POS
Antibody Screen: NEGATIVE

## 2022-07-21 MED ORDER — SOD CITRATE-CITRIC ACID 500-334 MG/5ML PO SOLN: 30.0000 mL | ORAL | Status: DC | PRN

## 2022-07-21 MED ORDER — DIBUCAINE (PERIANAL) 1 % EX OINT: 1.0000 | TOPICAL_OINTMENT | CUTANEOUS | Status: DC | PRN

## 2022-07-21 MED ORDER — ACETAMINOPHEN 325 MG PO TABS: 650.0000 mg | ORAL_TABLET | ORAL | Status: DC | PRN

## 2022-07-21 MED ORDER — ONDANSETRON HCL 4 MG/2ML IJ SOLN: 4.0000 mg | Freq: Four times a day (QID) | INTRAMUSCULAR | Status: DC | PRN

## 2022-07-21 MED ORDER — DIPHENHYDRAMINE HCL 25 MG PO CAPS: 25.0000 mg | ORAL_CAPSULE | Freq: Four times a day (QID) | ORAL | Status: DC | PRN

## 2022-07-21 MED ORDER — IBUPROFEN 600 MG PO TABS
600.0000 mg | ORAL_TABLET | Freq: Four times a day (QID) | ORAL | Status: DC
  Administered 2022-07-21 – 2022-07-22 (×4): 600 mg via ORAL
  Filled 2022-07-21 (×4): qty 1

## 2022-07-21 MED ORDER — SIMETHICONE 80 MG PO CHEW: 80.0000 mg | CHEWABLE_TABLET | ORAL | Status: DC | PRN

## 2022-07-21 MED ORDER — PRENATAL MULTIVITAMIN CH
1.0000 | ORAL_TABLET | Freq: Every day | ORAL | Status: DC
  Administered 2022-07-21: 1 via ORAL
  Filled 2022-07-21: qty 1

## 2022-07-21 MED ORDER — WITCH HAZEL-GLYCERIN EX PADS: 1.0000 | MEDICATED_PAD | CUTANEOUS | Status: DC | PRN

## 2022-07-21 MED ORDER — ACETAMINOPHEN 500 MG PO TABS
1000.0000 mg | ORAL_TABLET | Freq: Four times a day (QID) | ORAL | Status: DC | PRN
  Administered 2022-07-21: 1000 mg via ORAL
  Filled 2022-07-21: qty 2

## 2022-07-21 MED ORDER — ONDANSETRON HCL 4 MG PO TABS: 4.0000 mg | ORAL_TABLET | ORAL | Status: DC | PRN

## 2022-07-21 MED ORDER — TETANUS-DIPHTH-ACELL PERTUSSIS 5-2.5-18.5 LF-MCG/0.5 IM SUSY: 0.5000 mL | PREFILLED_SYRINGE | Freq: Once | INTRAMUSCULAR | Status: DC

## 2022-07-21 MED ORDER — OXYTOCIN 10 UNIT/ML IJ SOLN: 10.0000 [IU] | Freq: Once | INTRAMUSCULAR | Status: DC

## 2022-07-21 MED ORDER — COCONUT OIL OIL: 1.0000 | TOPICAL_OIL | Status: DC | PRN

## 2022-07-21 MED ORDER — ONDANSETRON HCL 4 MG/2ML IJ SOLN: 4.0000 mg | INTRAMUSCULAR | Status: DC | PRN

## 2022-07-21 MED ORDER — BENZOCAINE-MENTHOL 20-0.5 % EX AERO
1.0000 | INHALATION_SPRAY | CUTANEOUS | Status: DC | PRN
  Administered 2022-07-21: 1 via TOPICAL
  Filled 2022-07-21: qty 56

## 2022-07-21 MED ORDER — LIDOCAINE HCL (PF) 1 % IJ SOLN
30.0000 mL | INTRAMUSCULAR | Status: AC | PRN
  Administered 2022-07-21: 30 mL via SUBCUTANEOUS
  Filled 2022-07-21: qty 30

## 2022-07-21 MED ORDER — ZOLPIDEM TARTRATE 5 MG PO TABS: 5.0000 mg | ORAL_TABLET | Freq: Every evening | ORAL | Status: DC | PRN

## 2022-07-21 MED ORDER — SENNOSIDES-DOCUSATE SODIUM 8.6-50 MG PO TABS: 2.0000 | ORAL_TABLET | Freq: Every day | ORAL | Status: DC

## 2022-07-21 MED ORDER — FENTANYL CITRATE (PF) 100 MCG/2ML IJ SOLN: 50.0000 ug | INTRAMUSCULAR | Status: DC | PRN

## 2022-07-21 NOTE — MAU Note (Signed)
Pt says UC's strong since 10pm. VE- at 36 weeks  2 cm  Denies HSV GBS- neg

## 2022-07-21 NOTE — Lactation Note (Signed)
This note was copied from a baby's chart. Lactation Consultation Note  Patient Name: Janet Edwards [id number] Date: 07/21/2022 Age:28 hours  P2, 38.6 GA  Mother states baby is breastfeeding well. She denies any discomfort with latch, questions or concerns. She reports she breast fed her last baby 13 months. (Other child is now 18 months old)  Instructed to feed baby with feeding cues, skin to skin and hand express and feed baby if not latching. Call for assistance as needed.   Follow up if mother calls for [facility] help or support.  Stana Bunting M 07/21/2022, 3:42 PM

## 2022-07-21 NOTE — H&P (Signed)
OB ADMISSION/ HISTORY & PHYSICAL:  Admission Date: 07/21/2022  1:52 AM  Admit Diagnosis: Normal labor [O80, Z37.9]    Janet Edwards is a 27 y.o. female G3P1011 at 64w6dpresenting for active labor. States contractions started at 10PM. Had bloody show earlier today. Denies leaking of fluid. Endorses + fetal movement. Husband, WGwyndolyn Saxon and mother, TOlivia Mackie present and supportive. Eagerly anticipating a baby boy with a surprise name!  Prenatal History: G3P1011   EDC: 07/29/2022 [facility]  Primary: M. Sigmon, CNM  Prenatal course complicated by: GERD, on Protonix  Prenatal Labs: ABO, Rh:   A POS Antibody:   Negative Rubella:   Immune RPR:   Non-reactive HBsAg:   Negative HIV:   Negative GBS:   Negative 1 hr Glucola : 104 Genetic Screening: Declined Ultrasound: normal XY anatomy, anterior placenta, EFW 89% at 35 weeks    Maternal Diabetes: No Genetic Screening: Declined Maternal Ultrasounds/Referrals: Normal Fetal Ultrasounds or other Referrals:  None Maternal Substance Abuse:  No Significant Maternal Medications:  Meds include: Protonix Significant Maternal Lab Results:  Group B Strep negative Other Comments:  None  Medical / Surgical History : Past medical history:  Past Medical History:  Diagnosis Date   Asthma    IBS (irritable bowel syndrome)     Past surgical history:  Past Surgical History:  Procedure Laterality Date   WISDOM TOOTH EXTRACTION      Family History:  Family History  Problem Relation Age of Onset   Cancer Maternal Grandfather        prostate cancer   Atrial fibrillation Paternal Grandfather     Social History:  reports that she has never smoked. She has never used smokeless tobacco. She reports that she does not currently use alcohol. She reports that she does not use drugs.  Allergies: Penicillins, Pneumococcal vaccine, and Vicks dayquil cough [dextromethorphan hbr]   Current Medications at time of  admission:  Medications Prior to Admission  Medication Sig Dispense Refill Last Dose   acetaminophen (TYLENOL) 325 MG tablet Take 2 tablets (650 mg total) by mouth every 4 (four) hours as needed (for pain scale < 4). 60 tablet 0 Past Month   ibuprofen (ADVIL) 600 MG tablet Take 1 tablet (600 mg total) by mouth every 6 (six) hours as needed. 60 tablet 0 Past Month   ondansetron (ZOFRAN) 4 MG tablet TAKE 1 TABLET BY MOUTH EVERY 8 HOURS AS NEEDED FOR NAUSEA FOR UP TO 7 DAYS   Past Week   pantoprazole (PROTONIX) 40 MG tablet Take 1 tablet by mouth daily.   07/20/2022   Prenatal Vit-Fe Fumarate-FA (PREPLUS) 27-1 MG TABS Take 1 tablet by mouth daily.   Past Week    Review of Systems: Review of Systems  All other systems reviewed and are negative.  Physical Exam: Vital signs and nursing notes reviewed.  Patient Vitals for the past 24 hrs:  BP Temp Temp src Pulse Resp Height Weight  07/21/22 0204 118/83 97.9 F (36.6 C) Oral 86 16 '5\' 4"'$  (1.626 m) 95.5 kg    General: AAO x 3, NAD Heart: RRR Lungs:CTAB Abdomen: Gravid, NT Extremities: no edema SVE: Dilation: 6 Effacement (%): 90 Station: -2 Presentation: Vertex Exam by:: Alondra Twitty, RN   FHR: 130BPM, moderate variability, + accels, no decels TOCO: Contractions every 3-4 minutes  Labs:   No results for input(s): "WBC", "HGB", "HCT", "PLT" in the last 72 hours.  Assessment/Plan: 27y.o. G3P1011 at 345w6dactive labor  Fetal wellbeing - FHT category 1 EFW AGA 7-8lbs  Labor: Plan expectant management, prep waterbirth tub now  GBS negative Rubella immune Rh positive  Pain control: desires waterbirth, class complete Analgesia/anesthesia PRN  Anticipated MOD: NSVB  Plans to breastfeed, plans inpatient circumcision. POC discussed with patient and support team, all questions answered.  Dr. Lanny Cramp notified of admission/plan of care.  Suzan Nailer CNM, MSN 07/21/2022, 4:41 AM

## 2022-07-22 LAB — CBC
HCT: 33.8 % — ABNORMAL LOW (ref 36.0–46.0)
Hemoglobin: 10.6 g/dL — ABNORMAL LOW (ref 12.0–15.0)
MCH: 26.8 pg (ref 26.0–34.0)
MCHC: 31.4 g/dL (ref 30.0–36.0)
MCV: 85.6 fL (ref 80.0–100.0)
Platelets: 216 10*3/uL (ref 150–400)
RBC: 3.95 MIL/uL (ref 3.87–5.11)
RDW: 13.8 % (ref 11.5–15.5)
WBC: 11.2 10*3/uL — ABNORMAL HIGH (ref 4.0–10.5)
nRBC: 0 % (ref 0.0–0.2)

## 2022-07-22 MED ORDER — ACETAMINOPHEN 325 MG PO TABS: 650.0000 mg | ORAL_TABLET | ORAL | Status: AC | PRN

## 2022-07-22 MED ORDER — BENZOCAINE-MENTHOL 20-0.5 % EX AERO: 1.0000 | INHALATION_SPRAY | CUTANEOUS | Status: AC | PRN

## 2022-07-22 MED ORDER — COCONUT OIL OIL: 1.0000 | TOPICAL_OIL | 0 refills | Status: DC | PRN

## 2022-07-22 MED ORDER — IBUPROFEN 600 MG PO TABS: 600.0000 mg | ORAL_TABLET | Freq: Four times a day (QID) | ORAL | 0 refills | Status: DC

## 2022-07-22 NOTE — Discharge Summary (Signed)
OB Discharge Summary  Patient Name: Janet Edwards DOB: [birth year] MRN: [id number]  Date of admission: 07/21/2022 Delivering provider: Gavin Potters K   Admitting diagnosis: Normal labor [O80, Z37.9] Intrauterine pregnancy: [redacted]w[redacted]d    Secondary diagnosis: Patient Active Problem List   Diagnosis Date Noted   SVD/Waterbirth 07/21/2022   First degree perineal laceration 07/21/2022   Postpartum care following vaginal delivery 3/2 07/21/2022   Normal labor 03/02/2021   Additional problems:none   Date of discharge: 07/22/2022   Discharge diagnosis: Principal Problem:   Postpartum care following vaginal delivery 3/2 Active Problems:   Normal labor   SVD/Waterbirth   First degree perineal laceration                                                              Post partum procedures: none  Augmentation: AROM Pain control: Local  Laceration:1st degree  Episiotomy:None  Complications: None  Hospital course:  Onset of Labor With Vaginal Delivery      27y.o. yo [id number] admitted in Active Labor on 07/21/2022. Labor course was complicated by none. Membrane Rupture Time/Date: 4:45 AM ,07/21/2022   Delivery Method:Vaginal, Spontaneous  Episiotomy: None  Lacerations:  1st degree  Patient had a postpartum course complicated by none  She is ambulating, tolerating a regular diet, passing flatus, and urinating well. Patient is discharged home in stable condition on 07/22/22.  Newborn Data: Birth date:07/21/2022  Birth time:6:24 AM  Gender:Female  Living status:Living  Apgars:6 ,9  Weight:4050 g   \Physical exam  Vitals:   07/21/22 1349 07/21/22 1759 07/21/22 2255 07/22/22 0540  BP: 120/73 130/83 111/72 113/78  Pulse: 77 87 93 67  Resp: '16 18 18 18  '$ Temp: 98 F (36.7 C) 98.2 F (36.8 C) 98.3 F (36.8 C) 97.9 F (36.6 C)  TempSrc: Oral Oral Oral Oral  SpO2:   98% 99%  Weight:      Height:       General: alert, cooperative, and no distress Lochia:  appropriate Uterine Fundus: firm Incision: N/A Perineum: repair intact, no edema DVT Evaluation: No cords or calf tenderness. No significant calf/ankle edema. Labs: Lab Results  Component Value Date   WBC 11.2 (H) 07/22/2022   HGB 10.6 (L) 07/22/2022   HCT 33.8 (L) 07/22/2022   MCV 85.6 07/22/2022   PLT 216 07/22/2022      Latest Ref Rng & Units 07/15/2020    9:49 AM  CMP  Glucose 70 - 99 mg/dL 92   BUN 6 - 20 mg/dL 10   Creatinine 0.44 - 1.00 mg/dL 0.69   Sodium 135 - 145 mmol/L 138   Potassium 3.5 - 5.1 mmol/L 4.2   Chloride 98 - 111 mmol/L 103   CO2 22 - 32 mmol/L 16   Calcium 8.9 - 10.3 mg/dL 9.4       07/21/2022    8:45 AM 03/04/2021   10:21 AM  Edinburgh Postnatal Depression Scale Screening Tool  I have been able to laugh and see the funny side of things. 0 0  I have looked forward with enjoyment to things. 0 0  I have blamed myself unnecessarily when things went wrong. 1 1  I have been anxious or worried for no good reason. 1 0  I  have felt scared or panicky for no good reason. 1 0  Things have been getting on top of me. 1 0  I have been so unhappy that I have had difficulty sleeping. 0 0  I have felt sad or miserable. 0 1  I have been so unhappy that I have been crying. 0 1  The thought of harming myself has occurred to me. 0 0  Edinburgh Postnatal Depression Scale Total 4 3   Discharge instruction:  per After Visit Summary,  [facility] booklet and  "[facility]" hospital booklet After Visit Meds:  Allergies as of 07/22/2022       Reactions   Penicillins Other (See Comments)   Mother told her she was unsure of what reaction    Pneumococcal Vaccine Other (See Comments)   Hematoma with mild to moderate localized reaction   Vicks Dayquil Cough [dextromethorphan Hbr] Nausea And Vomiting        Medication List     TAKE these medications    acetaminophen 325 MG tablet Commonly known as: Tylenol Take 2 tablets (650 mg total) by  mouth every 4 (four) hours as needed (for pain scale < 4).   benzocaine-Menthol 20-0.5 % Aero Commonly known as: DERMOPLAST Apply 1 Application topically as needed for irritation (perineal discomfort).   coconut oil Oil Apply 1 Application topically as needed.   ibuprofen 600 MG tablet Commonly known as: ADVIL Take 1 tablet (600 mg total) by mouth every 6 (six) hours.   ondansetron 4 MG tablet Commonly known as: ZOFRAN Take 4 mg by mouth every 8 (eight) hours as needed for nausea.   pantoprazole 40 MG tablet Commonly known as: PROTONIX Take 40 mg by mouth daily.   PRENATAL PO Take 1 tablet by mouth daily.               Discharge Care Instructions  (From admission, onward)           Start     Ordered   07/22/22 0000  Discharge wound care:       Comments: Sitz baths 2 times /day with warm water x 1 week. May add herbals: 1 ounce dried comfrey leaf* 1 ounce calendula flowers 1 ounce lavender flowers  Supplies can be found online at [employer] sources at [employer], [employer]  1/2 ounce dried uva ursi leaves 1/2 ounce witch hazel blossoms (if you can find them) 1/2 ounce dried sage leaf 1/2 cup sea salt Directions: Bring 2 quarts of water to a boil. Turn off heat, and place 1 ounce (approximately 1 large handful) of the above mixed herbs (not the salt) into the pot. Steep, covered, for 30 minutes.  Strain the liquid well with a fine mesh strainer, and discard the herb material. Add 2 quarts of liquid to the tub, along with the 1/2 cup of salt. This medicinal liquid can also be made into compresses and peri-rinses.   07/22/22 0940           Diet: routine diet Activity: Advance as tolerated. Pelvic rest for 6 weeks.  Postpartum contraception: TBA in office Newborn Data: Live born female  Birth Weight: 8 lb 14.9 oz (4050 g) APGAR: 6, 9  Newborn Delivery   Birth date/time: 07/21/2022 06:24:00 Delivery type: Vaginal, Spontaneous       named Ryder Baby Feeding: Breast Disposition:home with mother Circumcision: completed inpatient / D. Eddie Dibbles, CNM Delivery Report: Review the Delivery Report for details.  Follow up:  Follow-up Information     Obgyn, Chief Operating Officer. Schedule an appointment as soon as possible for a visit in 6 week(s).   Why: For Postpartum follow-up Contact information: [address] [place] [address] [phone]                Signed: Otilio Carpen, MSN 07/22/2022, 9:41 AM

## 2022-07-22 NOTE — Discharge Instructions (Signed)
Lactation outpatient support - home visit   Sonda Primes, [place] (lactation consultant)  & Birth Doula  Phone (text or call): [phone] Email: [email or web address]   Scherry Ran RN, MHA, IBCLC at [employer]: Lactation Consultant  [email or web address] Mail: [email or web address] Tel: [phone]   Additional breastfeeding resources:  [facility] [email or web address]://ibconline.ca/information-sheets/  [employer]  [email or web address]   Other Resources:  Chiropractic specialist   Dr. Marja Kays [email or web address]   Craniosacral therapy for baby  Roylene Reason  [email or web address]  Pediatric Dentist (for tongue ties)  Dr. Jacelyn Grip, [facility]  Phone: [phone]  [address] [place] [address] [email or web address]

## 2022-07-31 ENCOUNTER — Telehealth (HOSPITAL_COMMUNITY): Payer: Self-pay | Admitting: *Deleted

## 2022-07-31 NOTE — Telephone Encounter (Signed)
Attempted hospital discharge follow-up call. Left message for patient to return RN call with any questions or concerns. Erline Levine, RN, 07/31/22, [phone]

## 2023-05-22 NOTE — L&D Delivery Note (Signed)
" ° °  Delivery Note:   [id number] at [redacted]w[redacted]d  Admitting diagnosis: Pregnant [Z34.90] Risks: None Onset of labor: 05/19/2024 at 2300 IOL/Augmentation: N/A ROM: 05/20/2024 at 0203  Complete dilation at 05/20/2024 0158 Onset of pushing at 0203 FHR second stage Cat II, terminal bradycardia  Analgesia/Anesthesia intrapartum:None Pushing in side lying position with CNM and L&D staff support. Husband, Elsie, and mother, Randine, present for birth and supportive.  Delivery of a Live born female  Birth Weight:  pending APGAR: 9, 9  Newborn Delivery   Birth date/time: 05/20/2024 02:06:35 Delivery type: Vaginal, Spontaneous    in cephalic presentation, position OA to ROA.  APGAR:1 min-9 , 5 min-9   Nuchal Cord: Yes  Cord double clamped after cessation of pulsation, cut by Elsie.  Collection of cord blood for typing completed. Arterial cord blood sample-No   Placenta delivered-Spontaneous with 3 vessels. Uterotonics: None Placenta to L&D Uterine tone firm  Bleeding scant  1st degree;Perineal laceration identified. Hemostatic, declined repair Episiotomy:None Local analgesia: N/A  Repair: Declined Est. Blood Loss (mL):7.00  Complications: None  Mom to postpartum. Ezella Moats to Couplet care / Skin to Skin.  Delivery Report:  Review the Delivery Report for details.    Alan MARLA Molt, CNM, MSN 05/20/2024, 2:39 AM   "

## 2023-08-15 ENCOUNTER — Ambulatory Visit
Admission: RE | Admit: 2023-08-15 | Discharge: 2023-08-15 | Disposition: A | Discharge status: Home / Self Care | Source: Ambulatory Visit | Attending: Family Medicine | Admitting: Family Medicine

## 2023-08-15 VITALS — BP 123/83 | HR 90 | Temp 98.6°F | Resp 18

## 2023-08-15 DIAGNOSIS — R350 Frequency of micturition: Secondary | ICD-10-CM | POA: Diagnosis not present

## 2023-08-15 LAB — URINALYSIS, W/ REFLEX TO CULTURE (INFECTION SUSPECTED)
Bilirubin Urine: NEGATIVE
Glucose, UA: NEGATIVE mg/dL
Ketones, ur: NEGATIVE mg/dL
Leukocytes,Ua: NEGATIVE
Nitrite: NEGATIVE
Protein, ur: NEGATIVE mg/dL
Specific Gravity, Urine: 1.01 (ref 1.005–1.030)
pH: 6.5 (ref 5.0–8.0)

## 2023-08-15 LAB — PREGNANCY, URINE: Preg Test, Ur: NEGATIVE

## 2023-08-15 NOTE — Discharge Instructions (Addendum)
 The clinic will contact you with results of the urine culture done today if positive.  Continue increasing fluids and rest.  Follow-up with your PCP in 2 days for recheck.  Please go to the ER for any worsening symptoms.  Hope you feel better soon!

## 2023-08-15 NOTE — ED Provider Notes (Signed)
 [facility]    CSN: [id number] Arrival date & time: 08/15/23  0934      History   Chief Complaint Chief Complaint  Patient presents with   Urinary Frequency    Urinaty urgency and some lower back pain on left side since Monday. Took AZO pills for two days and no relief. - Entered by patient   Back Pain    HPI Janet Edwards is a 28 y.o. female presents for urinary send [place].  Patient reports 5 days of urinary urgency with some low back pain.  Denies fevers, nausea/vomiting, flank pain, hematuria, burning with urination or frequency.  No vaginal discharge or STD concern.  She is taking Azo OTC with minimal improvement.  She is breast-feeding.  No other concerns time.   Urinary Frequency  Back Pain   Past Medical History:  Diagnosis Date   Asthma    IBS (irritable bowel syndrome)     Patient Active Problem List   Diagnosis Date Noted   SVD/Waterbirth 07/21/2022   First degree perineal laceration 07/21/2022   Postpartum care following vaginal delivery 3/2 07/21/2022   Normal labor 03/02/2021    Past Surgical History:  Procedure Laterality Date   WISDOM TOOTH EXTRACTION      OB History     Gravida  3   Para  2   Term  2   Preterm  0   AB  1   Living  2      SAB  1   IAB  0   Ectopic  0   Multiple  0   Live Births  2            Home Medications    Prior to Admission medications   Medication Sig Start Date End Date Taking? Authorizing Provider  acetaminophen (TYLENOL) 325 MG tablet Take 2 tablets (650 mg total) by mouth every 4 (four) hours as needed (for pain scale < 4). 07/22/22   Neta Mends, CNM  benzocaine-Menthol (DERMOPLAST) 20-0.5 % AERO Apply 1 Application topically as needed for irritation (perineal discomfort). 07/22/22   Neta Mends, CNM  coconut oil OIL Apply 1 Application topically as needed. 07/22/22   Neta Mends, CNM  ibuprofen (ADVIL) 600 MG tablet Take 1 tablet (600 mg total) by mouth every 6 (six)  hours. 07/22/22   Neta Mends, CNM  ondansetron (ZOFRAN) 4 MG tablet Take 4 mg by mouth every 8 (eight) hours as needed for nausea.    [provider]  pantoprazole (PROTONIX) 40 MG tablet Take 40 mg by mouth daily. 11/30/21   [provider]  Prenatal Vit-Fe Fumarate-FA (PRENATAL PO) Take 1 tablet by mouth daily.    [provider]    Family History Family History  Problem Relation Age of Onset   Healthy Mother    Healthy Father    Healthy Sister    Irritable bowel syndrome Sister    Cancer Maternal Grandfather        prostate cancer   Atrial fibrillation Paternal Grandfather     Social History Social History   Tobacco Use   Smoking status: Never    Passive exposure: Never   Smokeless tobacco: Never  Vaping Use   Vaping status: Never Used  Substance Use Topics   Alcohol use: Not Currently    Comment: socially    Drug use: Never     Allergies   Penicillins, Pneumococcal vaccine, and Vicks dayquil cough [dextromethorphan hbr]  Review of Systems Review of Systems  Genitourinary:  Positive for frequency.  Musculoskeletal:  Positive for back pain.     Physical Exam Triage Vital Signs ED Triage Vitals  Encounter Vitals Group     BP 08/15/23 1000 123/83     Systolic BP Percentile --      Diastolic BP Percentile --      Pulse Rate 08/15/23 1000 90     Resp 08/15/23 1000 18     Temp 08/15/23 1000 98.6 F (37 C)     Temp Source 08/15/23 1000 Oral     SpO2 08/15/23 1000 99 %     Weight --      Height --      Head Circumference --      Peak Flow --      Pain Score 08/15/23 0957 6     Pain Loc --      Pain Education --      Exclude from Growth Chart --    No data found.  Updated Vital Signs BP 123/83 (BP Location: Right Arm)   Pulse 90   Temp 98.6 F (37 C) (Oral)   Resp 18   LMP 08/14/2023   SpO2 99%   Breastfeeding Yes   Visual Acuity Right Eye Distance:   Left Eye Distance:   Bilateral Distance:    Right Eye  Near:   Left Eye Near:    Bilateral Near:     Physical Exam Vitals and nursing note reviewed.  Constitutional:      Appearance: Normal appearance.  HENT:     Head: Normocephalic and atraumatic.  Eyes:     Pupils: Pupils are equal, round, and reactive to light.  Cardiovascular:     Rate and Rhythm: Normal rate.  Pulmonary:     Effort: Pulmonary effort is normal.  Abdominal:     Tenderness: There is no right CVA tenderness or left CVA tenderness.  Musculoskeletal:     Lumbar back: Tenderness present. No spasms or bony tenderness.       Back:  Skin:    General: Skin is warm and dry.  Neurological:     General: No focal deficit present.     Mental Status: She is alert and oriented to person, place, and time.  Psychiatric:        Mood and Affect: Mood normal.        Behavior: Behavior normal.      UC Treatments / Results  Labs (all labs ordered are listed, but only abnormal results are displayed) Labs Reviewed  URINALYSIS, W/ REFLEX TO CULTURE (INFECTION SUSPECTED) - Abnormal; Notable for the following components:      Result Value   Hgb urine dipstick SMALL (*)    Bacteria, UA FEW (*)    All other components within normal limits  URINE CULTURE  PREGNANCY, URINE    EKG   Radiology No results found.  Procedures Procedures (including critical care time)  Medications Ordered in UC Medications - No data to display  Initial Impression / Assessment and Plan / UC Course  I have reviewed the triage vital signs and the nursing notes.  Pertinent labs & imaging results that were available during my care of the patient were reviewed by me and considered in my medical decision making (see chart for details).     Reviewed exam and symptoms with patient.  No red flags.  Urine with blood without leuks or nitrates.  Will send urine culture based  on symptoms.  Patient wishes to wait results prior to initiating treatment.  Discussed increasing fluids and rest.  PCP follow-up  2 days for recheck.  ER precautions reviewed. Final Clinical Impressions(s) / UC Diagnoses   Final diagnoses:  Urinary frequency     Discharge Instructions      The clinic will contact you with results of the urine culture done today if positive.  Continue increasing fluids and rest.  Follow-up with your PCP in 2 days for recheck.  Please go to the ER for any worsening symptoms.  Hope you feel better soon!     ED Prescriptions   None    PDMP not reviewed this encounter.   Radford Pax, NP 08/15/23 1039

## 2023-08-15 NOTE — ED Triage Notes (Signed)
 Pt c/o left lower back pain and urinary urgency.   Start date: 08/11/2023  Home Interventions: Azo

## 2023-08-16 ENCOUNTER — Telehealth: Payer: Self-pay

## 2023-08-16 LAB — URINE CULTURE: Culture: 30000 — AB

## 2023-10-31 LAB — OB RESULTS CONSOLE GC/CHLAMYDIA
Chlamydia: NEGATIVE
Neisseria Gonorrhea: NEGATIVE

## 2023-10-31 LAB — OB RESULTS CONSOLE RPR: RPR: NONREACTIVE

## 2023-10-31 LAB — OB RESULTS CONSOLE GBS: GBS: NEGATIVE

## 2023-10-31 LAB — OB RESULTS CONSOLE HEPATITIS B SURFACE ANTIGEN: Hepatitis B Surface Ag: NEGATIVE

## 2023-10-31 LAB — OB RESULTS CONSOLE HIV ANTIBODY (ROUTINE TESTING): HIV: NONREACTIVE

## 2023-10-31 LAB — OB RESULTS CONSOLE RUBELLA ANTIBODY, IGM: Rubella: IMMUNE

## 2023-11-18 ENCOUNTER — Other Ambulatory Visit: Payer: Self-pay

## 2023-11-18 ENCOUNTER — Inpatient Hospital Stay (HOSPITAL_COMMUNITY)
Admission: AD | Admit: 2023-11-18 | Discharge: 2023-11-18 | Disposition: A | Discharge status: Home / Self Care | Attending: Obstetrics & Gynecology | Admitting: Obstetrics & Gynecology

## 2023-11-18 ENCOUNTER — Encounter (HOSPITAL_COMMUNITY): Payer: Self-pay | Admitting: *Deleted

## 2023-11-18 DIAGNOSIS — O210 Mild hyperemesis gravidarum: Secondary | ICD-10-CM

## 2023-11-18 DIAGNOSIS — E860 Dehydration: Secondary | ICD-10-CM | POA: Diagnosis not present

## 2023-11-18 DIAGNOSIS — O99281 Endocrine, nutritional and metabolic diseases complicating pregnancy, first trimester: Secondary | ICD-10-CM

## 2023-11-18 DIAGNOSIS — Z3A13 13 weeks gestation of pregnancy: Secondary | ICD-10-CM

## 2023-11-18 LAB — COMPREHENSIVE METABOLIC PANEL WITH GFR
ALT: 14 U/L (ref 0–44)
AST: 17 U/L (ref 15–41)
Albumin: 3.5 g/dL (ref 3.5–5.0)
Alkaline Phosphatase: 48 U/L (ref 38–126)
Anion gap: 15 (ref 5–15)
BUN: 7 mg/dL (ref 6–20)
CO2: 19 mmol/L — ABNORMAL LOW (ref 22–32)
Calcium: 9.4 mg/dL (ref 8.9–10.3)
Chloride: 102 mmol/L (ref 98–111)
Creatinine, Ser: 0.64 mg/dL (ref 0.44–1.00)
GFR, Estimated: >60 mL/min (ref >60)
Glucose, Bld: 112 mg/dL — ABNORMAL HIGH (ref 70–99)
Potassium: 3.5 mmol/L (ref 3.5–5.1)
Sodium: 136 mmol/L (ref 135–145)
Total Bilirubin: 1.8 mg/dL — ABNORMAL HIGH (ref 0.0–1.2)
Total Protein: 7.8 g/dL (ref 6.5–8.1)

## 2023-11-18 LAB — URINALYSIS, ROUTINE W REFLEX MICROSCOPIC
Bilirubin Urine: NEGATIVE
Glucose, UA: NEGATIVE mg/dL
Ketones, ur: 80 mg/dL — AB
Nitrite: NEGATIVE
Protein, ur: 100 mg/dL — AB
Specific Gravity, Urine: 1.028 (ref 1.005–1.030)
pH: 5 (ref 5.0–8.0)

## 2023-11-18 LAB — CBC
HCT: 43.7 % (ref 36.0–46.0)
Hemoglobin: 15.5 g/dL — ABNORMAL HIGH (ref 12.0–15.0)
MCH: 32.2 pg (ref 26.0–34.0)
MCHC: 35.5 g/dL (ref 30.0–36.0)
MCV: 90.7 fL (ref 80.0–100.0)
Platelets: 318 10*3/uL (ref 150–400)
RBC: 4.82 MIL/uL (ref 3.87–5.11)
RDW: 12.3 % (ref 11.5–15.5)
WBC: 12.2 10*3/uL — ABNORMAL HIGH (ref 4.0–10.5)
nRBC: 0 % (ref 0.0–0.2)

## 2023-11-18 LAB — MAGNESIUM: Magnesium: 2.1 mg/dL (ref 1.7–2.4)

## 2023-11-18 MED ORDER — SCOPOLAMINE 1 MG/3DAYS TD PT72: 1.0000 | MEDICATED_PATCH | TRANSDERMAL | 12 refills | Status: AC

## 2023-11-18 MED ORDER — LACTATED RINGERS IV BOLUS
1000.0000 mL | Freq: Once | INTRAVENOUS | Status: AC
  Administered 2023-11-18: 1000 mL via INTRAVENOUS

## 2023-11-18 MED ORDER — METOCLOPRAMIDE HCL 5 MG/ML IJ SOLN
10.0000 mg | Freq: Once | INTRAMUSCULAR | Status: AC
  Administered 2023-11-18: 10 mg via INTRAVENOUS
  Filled 2023-11-18: qty 2

## 2023-11-18 MED ORDER — METOCLOPRAMIDE HCL 10 MG PO TABS: 10.0000 mg | ORAL_TABLET | Freq: Four times a day (QID) | ORAL | 0 refills | Status: DC | PRN

## 2023-11-18 MED ORDER — FAMOTIDINE IN NACL 20-0.9 MG/50ML-% IV SOLN
20.0000 mg | Freq: Once | INTRAVENOUS | Status: AC
  Administered 2023-11-18: 20 mg via INTRAVENOUS
  Filled 2023-11-18: qty 50

## 2023-11-18 NOTE — MAU Note (Signed)
 Janet Edwards is a 28 y.o. at [redacted]w[redacted]d here in MAU reporting: extreme vomiting, states hasn't been able to keep anything down for 48 hours.  Reports she has Zofran  prescribed, took it last yesterday and threw it up.  States stomach is hurting, feels like it's in knots.  Reports also has  SOB, due to dehydration and heart is racing.    LMP: 08/14/2023 Onset of complaint: 2 days  Pain score: 2 Vitals:   11/18/23 0825  BP: 113/76  Pulse: (!) 127  Resp: 20  Temp: 98.2 F (36.8 C)  SpO2: 99%     FHT: 164 bpm  Lab orders placed from triage: UA

## 2023-11-18 NOTE — MAU Provider Note (Signed)
 Chief Complaint:  Nausea, Emesis, and Shortness of Breath   HPI    Janet Edwards is a 28 y.o. [id number] at [redacted]w[redacted]d who presents to maternity admissions reporting extreme  N/V for the past 4 days. She reports she hasn't been able to tolerate any food/fluids PO. She has Zofran  which isn't helping and her stomach is hurting from all the N/V and she feels like she is dehydrated  Pregnancy Course: [facility]  Past Medical History:  Diagnosis Date   Asthma    IBS (irritable bowel syndrome)    OB History  Gravida Para Term Preterm AB Living  4 2 2  0 1 2  SAB IAB Ectopic Multiple Live Births  1 0 0 0 2    # Outcome Date GA Lbr Len/2nd Weight Sex Type Anes PTL Lv  4 Current           3 Term 07/21/22 [redacted]w[redacted]d 08:22 / 00:02 4050 g M Vag-Spont Local  LIV  2 Term 03/02/21 110w1d 06:51 / 01:02 3535 g F Vag-Spont EPI  LIV  1 SAB            Past Surgical History:  Procedure Laterality Date   WISDOM TOOTH EXTRACTION     Family History  Problem Relation Age of Onset   Healthy Mother    Healthy Father    Healthy Sister    Irritable bowel syndrome Sister    Cancer Maternal Grandfather        prostate cancer   Atrial fibrillation Paternal Grandfather    Social History   Tobacco Use   Smoking status: Never    Passive exposure: Never   Smokeless tobacco: Never  Vaping Use   Vaping status: Never Used  Substance Use Topics   Alcohol use: Not Currently    Comment: socially    Drug use: Never   Allergies  Allergen Reactions   Penicillins Other (See Comments)    Mother told her she was unsure of what reaction    Pneumococcal Vaccine Other (See Comments)    Hematoma with mild to moderate localized reaction   Vicks Dayquil Cough [Dextromethorphan Hbr] Nausea And Vomiting   Medications Prior to Admission  Medication Sig Dispense Refill Last Dose/Taking   acetaminophen  (TYLENOL ) 325 MG tablet Take 2 tablets (650 mg total) by mouth every 4 (four) hours as needed (for pain scale <  4).   Past Week   ondansetron  (ZOFRAN ) 4 MG tablet Take 4 mg by mouth every 8 (eight) hours as needed for nausea.   11/17/2023   benzocaine -Menthol  (DERMOPLAST) 20-0.5 % AERO Apply 1 Application topically as needed for irritation (perineal discomfort). (Patient not taking: Reported on 11/18/2023)   Not Taking   coconut oil OIL Apply 1 Application topically as needed. (Patient not taking: Reported on 11/18/2023)  0 Not Taking   ibuprofen  (ADVIL ) 600 MG tablet Take 1 tablet (600 mg total) by mouth every 6 (six) hours. (Patient not taking: Reported on 11/18/2023) 30 tablet 0 Not Taking   pantoprazole (PROTONIX) 40 MG tablet Take 40 mg by mouth daily.   Unknown   Prenatal Vit-Fe Fumarate-FA (PRENATAL PO) Take 1 tablet by mouth daily.   Unknown    I have reviewed patient's Past Medical Hx, Surgical Hx, Family Hx, Social Hx, medications and allergies.   ROS  Pertinent items noted in HPI and remainder of comprehensive ROS otherwise negative.   PHYSICAL EXAM  Patient Vitals for the past 24 hrs:  BP Temp Temp src Pulse  Resp SpO2 Height Weight  11/18/23 0905 -- -- -- -- -- 99 % -- --  11/18/23 0904 120/79 -- -- 97 18 -- -- --  11/18/23 0843 101/66 -- -- 100 18 97 % -- --  11/18/23 0825 113/76 98.2 F (36.8 C) Oral (!) 127 20 99 % -- --  11/18/23 0818 -- -- -- -- -- -- 5' 4 (1.626 m) 73.4 kg    Constitutional: Well-developed, well-nourished female observed vomiting  Cardiovascular: tachycardia , warm and well-perfused Respiratory: normal effort, no problems with respiration noted GI: Abd soft, non-tender, gravid MS: Extremities nontender, no edema, normal ROM Neurologic: Alert and oriented x 4.  GU: no CVA tenderness Pelvic: Deferred      Fetal Tracing: 164 via Doppler  :    Labs: Results for orders placed or performed during the hospital encounter of 11/18/23 (from the past 24 hours)  Urinalysis, Routine w reflex microscopic -Urine, Clean Catch     Status: Abnormal   Collection Time:  11/18/23  8:34 AM  Result Value Ref Range   Color, Urine AMBER (A) YELLOW   APPearance HAZY (A) CLEAR   Specific Gravity, Urine 1.028 1.005 - 1.030   pH 5.0 5.0 - 8.0   Glucose, UA NEGATIVE NEGATIVE mg/dL   Hgb urine dipstick SMALL (A) NEGATIVE   Bilirubin Urine NEGATIVE NEGATIVE   Ketones, ur 80 (A) NEGATIVE mg/dL   Protein, ur 899 (A) NEGATIVE mg/dL   Nitrite NEGATIVE NEGATIVE   Leukocytes,Ua TRACE (A) NEGATIVE   RBC / HPF 0-5 0 - 5 RBC/hpf   WBC, UA 6-10 0 - 5 WBC/hpf   Bacteria, UA FEW (A) NONE SEEN   Squamous Epithelial / HPF 6-10 0 - 5 /HPF   Mucus PRESENT   CBC     Status: Abnormal   Collection Time: 11/18/23  8:57 AM  Result Value Ref Range   WBC 12.2 (H) 4.0 - 10.5 K/uL   RBC 4.82 3.87 - 5.11 MIL/uL   Hemoglobin 15.5 (H) 12.0 - 15.0 g/dL   HCT 56.2 63.9 - 53.9 %   MCV 90.7 80.0 - 100.0 fL   MCH 32.2 26.0 - 34.0 pg   MCHC 35.5 30.0 - 36.0 g/dL   RDW 87.6 88.4 - 84.4 %   Platelets 318 150 - 400 K/uL   nRBC 0.0 0.0 - 0.2 %  Comprehensive metabolic panel     Status: Abnormal   Collection Time: 11/18/23  8:57 AM  Result Value Ref Range   Sodium 136 135 - 145 mmol/L   Potassium 3.5 3.5 - 5.1 mmol/L   Chloride 102 98 - 111 mmol/L   CO2 19 (L) 22 - 32 mmol/L   Glucose, Bld 112 (H) 70 - 99 mg/dL   BUN 7 6 - 20 mg/dL   Creatinine, Ser 9.35 0.44 - 1.00 mg/dL   Calcium 9.4 8.9 - 89.6 mg/dL   Total Protein 7.8 6.5 - 8.1 g/dL   Albumin 3.5 3.5 - 5.0 g/dL   AST 17 15 - 41 U/L   ALT 14 0 - 44 U/L   Alkaline Phosphatase 48 38 - 126 U/L   Total Bilirubin 1.8 (H) 0.0 - 1.2 mg/dL   GFR, Estimated >39 >39 mL/min   Anion gap 15 5 - 15  Magnesium     Status: None   Collection Time: 11/18/23  8:57 AM  Result Value Ref Range   Magnesium 2.1 1.7 - 2.4 mg/dL    Imaging:  No results found.  MDM & MAU COURSE  MDM:  HIGH  Hyperemesis in pregnancy CBC: r/o infection CMP r/o electrolyte abnormalities Magnesium level: r/o hypomagnesemia UA: C/W Dehydration IVF: 2 liters  of LR  Given for dehydration  IV Antiemetics : For hyperemesis with good relief noted   Labs c/w Dehydration, no electrolyte abnormalities at this time Plan to discharge home with Reglan  and Scopolamine patch for hyperemesis    MAU Course: Orders Placed This Encounter  Procedures   Urinalysis, Routine w reflex microscopic -Urine, Clean Catch   CBC   Comprehensive metabolic panel   Magnesium   Discharge patient Discharge disposition: 01-Home or Self Care; Discharge patient date: 11/18/2023   Meds ordered this encounter  Medications   lactated ringers  bolus 1,000 mL   metoCLOPramide  (REGLAN ) injection 10 mg   famotidine  (PEPCID ) IVPB 20 mg premix   lactated ringers  bolus 1,000 mL    2nd bag due to dehydration   scopolamine (TRANSDERM-SCOP) 1 MG/3DAYS    Sig: Place 1 patch (1.5 mg total) onto the skin every 3 (three) days.    Dispense:  10 patch    Refill:  12    Supervising Provider:   PRATT, TANYA S [[id number]]   metoCLOPramide  (REGLAN ) 10 MG tablet    Sig: Take 1 tablet (10 mg total) by mouth every 6 (six) hours as needed for nausea or vomiting.    Dispense:  30 tablet    Refill:  0    Supervising Provider:   PRATT, TANYA S [[id number]]      I have reviewed the patient chart and performed the physical exam . I have ordered & interpreted the lab results and reviewed them with the patient  Medications ordered as stated below.  A/P as described below.  Counseling and education provided and patient agreeable  with plan as described below. Verbalized understanding.    ASSESSMENT   1. Hyperemesis complicating pregnancy, antepartum   2. Dehydration during pregnancy   3. [redacted] weeks gestation of pregnancy     PLAN  Discharge home in stable condition with return precautions.   Follow up with your OB   See AVS for full description of information given to the patient including both verbal and written. Patient verbalized understanding and agrees with the plan as described above.       Allergies as of 11/18/2023       Reactions   Penicillins Other (See Comments)   Mother told her she was unsure of what reaction    Pneumococcal Vaccine Other (See Comments)   Hematoma with mild to moderate localized reaction   Vicks Dayquil Cough [dextromethorphan Hbr] Nausea And Vomiting        Medication List     STOP taking these medications    coconut oil Oil   ibuprofen  600 MG tablet Commonly known as: ADVIL        TAKE these medications    acetaminophen  325 MG tablet Commonly known as: Tylenol  Take 2 tablets (650 mg total) by mouth every 4 (four) hours as needed (for pain scale < 4).   benzocaine -Menthol  20-0.5 % Aero Commonly known as: DERMOPLAST Apply 1 Application topically as needed for irritation (perineal discomfort).   metoCLOPramide  10 MG tablet Commonly known as: REGLAN  Take 1 tablet (10 mg total) by mouth every 6 (six) hours as needed for nausea or vomiting.   ondansetron  4 MG tablet Commonly known as: ZOFRAN  Take 4 mg by mouth every 8 (eight) hours as needed for nausea.   pantoprazole 40  MG tablet Commonly known as: PROTONIX Take 40 mg by mouth daily.   PRENATAL PO Take 1 tablet by mouth daily.   scopolamine 1 MG/3DAYS Commonly known as: TRANSDERM-SCOP Place 1 patch (1.5 mg total) onto the skin every 3 (three) days.        Janet Dalton, MSN, [facility] [employer]

## 2024-05-20 ENCOUNTER — Inpatient Hospital Stay (HOSPITAL_COMMUNITY): Admission: RE | Admit: 2024-05-20 | Source: Home / Self Care | Admitting: Obstetrics and Gynecology

## 2024-05-20 ENCOUNTER — Inpatient Hospital Stay (HOSPITAL_COMMUNITY)
Admission: AD | Admit: 2024-05-20 | Discharge: 2024-05-21 | DRG: 807 | Disposition: A | Discharge status: Home / Self Care | Attending: Obstetrics & Gynecology | Admitting: Obstetrics & Gynecology

## 2024-05-20 ENCOUNTER — Encounter (HOSPITAL_COMMUNITY): Payer: Self-pay | Admitting: Obstetrics & Gynecology

## 2024-05-20 DIAGNOSIS — Z3A40 40 weeks gestation of pregnancy: Secondary | ICD-10-CM

## 2024-05-20 DIAGNOSIS — O26893 Other specified pregnancy related conditions, third trimester: Secondary | ICD-10-CM | POA: Diagnosis present

## 2024-05-20 LAB — CBC
HCT: 33.3 % — ABNORMAL LOW (ref 36.0–46.0)
Hemoglobin: 10.9 g/dL — ABNORMAL LOW (ref 12.0–15.0)
MCH: 27.9 pg (ref 26.0–34.0)
MCHC: 32.7 g/dL (ref 30.0–36.0)
MCV: 85.2 fL (ref 80.0–100.0)
Platelets: 234 K/uL (ref 150–400)
RBC: 3.91 MIL/uL (ref 3.87–5.11)
RDW: 14.6 % (ref 11.5–15.5)
WBC: 17 K/uL — ABNORMAL HIGH (ref 4.0–10.5)
nRBC: 0 % (ref 0.0–0.2)

## 2024-05-20 MED ORDER — SODIUM CHLORIDE 0.9 % IV SOLN: INTRAVENOUS | Status: AC | PRN

## 2024-05-20 MED ORDER — IBUPROFEN 600 MG PO TABS
600.0000 mg | ORAL_TABLET | Freq: Four times a day (QID) | ORAL | Status: DC
  Administered 2024-05-20 – 2024-05-21 (×5): 600 mg via ORAL
  Filled 2024-05-20 (×6): qty 1

## 2024-05-20 MED ORDER — SENNOSIDES-DOCUSATE SODIUM 8.6-50 MG PO TABS
2.0000 | ORAL_TABLET | Freq: Every day | ORAL | Status: DC
  Filled 2024-05-20: qty 2

## 2024-05-20 MED ORDER — OXYTOCIN 10 UNIT/ML IJ SOLN: 10.0000 [IU] | Freq: Once | INTRAMUSCULAR | Status: DC

## 2024-05-20 MED ORDER — OXYTOCIN-SODIUM CHLORIDE 30-0.9 UT/500ML-% IV SOLN: 2.5000 [IU]/h | INTRAVENOUS | Status: DC

## 2024-05-20 MED ORDER — SIMETHICONE 80 MG PO CHEW: 80.0000 mg | CHEWABLE_TABLET | ORAL | Status: DC | PRN

## 2024-05-20 MED ORDER — OXYTOCIN 10 UNIT/ML IJ SOLN
INTRAMUSCULAR | Status: AC
  Filled 2024-05-20: qty 1

## 2024-05-20 MED ORDER — TETANUS-DIPHTH-ACELL PERTUSSIS 5-2-15.5 LF-MCG/0.5 IM SUSP: 0.5000 mL | Freq: Once | INTRAMUSCULAR | Status: DC

## 2024-05-20 MED ORDER — WITCH HAZEL-GLYCERIN EX PADS: 1.0000 | MEDICATED_PAD | CUTANEOUS | Status: DC | PRN

## 2024-05-20 MED ORDER — SOD CITRATE-CITRIC ACID 500-334 MG/5ML PO SOLN: 30.0000 mL | ORAL | Status: DC | PRN

## 2024-05-20 MED ORDER — ONDANSETRON HCL 4 MG PO TABS: 4.0000 mg | ORAL_TABLET | ORAL | Status: DC | PRN

## 2024-05-20 MED ORDER — SODIUM CHLORIDE 0.9% FLUSH: 3.0000 mL | INTRAVENOUS | Status: DC | PRN

## 2024-05-20 MED ORDER — FENTANYL CITRATE (PF) 100 MCG/2ML IJ SOLN: 50.0000 ug | INTRAMUSCULAR | Status: DC | PRN

## 2024-05-20 MED ORDER — BENZOCAINE-MENTHOL 20-0.5 % EX AERO
1.0000 | INHALATION_SPRAY | CUTANEOUS | Status: DC | PRN
  Filled 2024-05-20: qty 56

## 2024-05-20 MED ORDER — ACETAMINOPHEN 325 MG PO TABS
650.0000 mg | ORAL_TABLET | ORAL | Status: DC | PRN
  Filled 2024-05-20: qty 2

## 2024-05-20 MED ORDER — COCONUT OIL OIL: 1.0000 | TOPICAL_OIL | Status: DC | PRN

## 2024-05-20 MED ORDER — LIDOCAINE HCL (PF) 1 % IJ SOLN: 30.0000 mL | INTRAMUSCULAR | Status: DC | PRN

## 2024-05-20 MED ORDER — DIBUCAINE (PERIANAL) 1 % EX OINT: 1.0000 | TOPICAL_OINTMENT | CUTANEOUS | Status: DC | PRN

## 2024-05-20 MED ORDER — ACETAMINOPHEN 500 MG PO TABS
1000.0000 mg | ORAL_TABLET | Freq: Four times a day (QID) | ORAL | Status: DC | PRN
  Administered 2024-05-20: 1000 mg via ORAL
  Filled 2024-05-20: qty 2

## 2024-05-20 MED ORDER — ONDANSETRON HCL 4 MG/2ML IJ SOLN: 4.0000 mg | Freq: Four times a day (QID) | INTRAMUSCULAR | Status: DC | PRN

## 2024-05-20 MED ORDER — LACTATED RINGERS IV SOLN: 500.0000 mL | INTRAVENOUS | Status: DC | PRN

## 2024-05-20 MED ORDER — ZOLPIDEM TARTRATE 5 MG PO TABS: 5.0000 mg | ORAL_TABLET | Freq: Every evening | ORAL | Status: DC | PRN

## 2024-05-20 MED ORDER — OXYTOCIN BOLUS FROM INFUSION: 333.0000 mL | Freq: Once | INTRAVENOUS | Status: DC

## 2024-05-20 MED ORDER — DIPHENHYDRAMINE HCL 25 MG PO CAPS: 25.0000 mg | ORAL_CAPSULE | Freq: Four times a day (QID) | ORAL | Status: DC | PRN

## 2024-05-20 MED ORDER — PRENATAL MULTIVITAMIN CH
1.0000 | ORAL_TABLET | Freq: Every day | ORAL | Status: DC
  Administered 2024-05-20 – 2024-05-21 (×2): 1 via ORAL
  Filled 2024-05-20 (×2): qty 1

## 2024-05-20 MED ORDER — ONDANSETRON HCL 4 MG/2ML IJ SOLN: 4.0000 mg | INTRAMUSCULAR | Status: DC | PRN

## 2024-05-20 MED ORDER — LACTATED RINGERS IV SOLN: INTRAVENOUS | Status: DC

## 2024-05-20 MED ORDER — SODIUM CHLORIDE 0.9% FLUSH
3.0000 mL | Freq: Two times a day (BID) | INTRAVENOUS | Status: DC
  Administered 2024-05-21: 3 mL via INTRAVENOUS

## 2024-05-20 NOTE — Progress Notes (Signed)
 RPR not done at Admission, Peds needs it.  New order placed

## 2024-05-20 NOTE — MAU Note (Addendum)
 Janet Edwards is a 28 y.o. at [redacted]w[redacted]d here in MAU reporting ctxs. Pt came in by w/c stating she felt like pushing. Taken directly to 129 by w/c and helped to change. SVE with BOWB and no cervix felt. Alan Molt CNM notified and in-house. Render Hahn RN CN on BS notified. FHR 146. To BS by stretcher.  LMP: na Onset of complaint: tonight Pain score: 10 There were no vitals filed for this visit.   FHT: 146  Lab orders placed from triage:

## 2024-05-20 NOTE — H&P (Signed)
 "  OB ADMISSION/ HISTORY & PHYSICAL:  Admission Date: 05/20/2024  1:51 AM  Admit Diagnosis: Pregnant [Z34.90]    Janet Edwards is a 28 y.o. female [phone] at [redacted]w[redacted]d presenting for active labor and precipitous delivery of a viable female. States contractions started a few hours ago. SROM immediately prior to delivery; clear fluid. Denies vaginal bleeding. Husband, Janet Edwards, and mother, Janet Edwards, present and supportive.   Prenatal History: [id number]   EDC: 05/20/2024 Prenatal care at [facility] since 10 weeks  Primary: M. Sigmon, CNM  Prenatal course complicated by: None  Prenatal Labs: ABO, Rh:   A POS Antibody:   Negative Rubella:   Immune RPR:   Non-reactive HBsAg:   Negative HIV:   Negative GBS:   Negative 1 hr Glucola : 92 Genetic Screening: Declined Ultrasound: normal anatomy, anterior placenta  Prenatal Transfer Tool  Maternal Diabetes: No Genetic Screening: Declined Maternal Ultrasounds/Referrals: Normal Fetal Ultrasounds or other Referrals:  None Maternal Substance Abuse:  No Significant Maternal Medications:  None Significant Maternal Lab Results: Group B Strep negative Number of Prenatal Visits:greater than 3 verified prenatal visits Maternal Vaccinations: Declined Other Comments:  None   Medical / Surgical History : Past medical history:  Past Medical History:  Diagnosis Date   Asthma    IBS (irritable bowel syndrome)     Past surgical history:  Past Surgical History:  Procedure Laterality Date   WISDOM TOOTH EXTRACTION      Family History:  Family History  Problem Relation Age of Onset   Healthy Mother    Healthy Father    Healthy Sister    Irritable bowel syndrome Sister    Cancer Maternal Grandfather        prostate cancer   Atrial fibrillation Paternal Grandfather     Social History:  reports that she has never smoked. She has never been exposed to tobacco smoke. She has never used smokeless tobacco. She reports that she does not  currently use alcohol. She reports that she does not use drugs.  Allergies: Penicillins, Pneumococcal vaccine, and Vicks dayquil cough [dextromethorphan hbr]   Current Medications at time of admission:  Medications Prior to Admission  Medication Sig Dispense Refill Last Dose/Taking   acetaminophen  (TYLENOL ) 325 MG tablet Take 2 tablets (650 mg total) by mouth every 4 (four) hours as needed (for pain scale < 4).      benzocaine -Menthol  (DERMOPLAST) 20-0.5 % AERO Apply 1 Application topically as needed for irritation (perineal discomfort). (Patient not taking: Reported on 11/18/2023)      metoCLOPramide  (REGLAN ) 10 MG tablet Take 1 tablet (10 mg total) by mouth every 6 (six) hours as needed for nausea or vomiting. 30 tablet 0    ondansetron  (ZOFRAN ) 4 MG tablet Take 4 mg by mouth every 8 (eight) hours as needed for nausea.      pantoprazole (PROTONIX) 40 MG tablet Take 40 mg by mouth daily.      Prenatal Vit-Fe Fumarate-FA (PRENATAL PO) Take 1 tablet by mouth daily.      scopolamine  (TRANSDERM-SCOP) 1 MG/3DAYS Place 1 patch (1.5 mg total) onto the skin every 3 (three) days. 10 patch 12     Review of Systems: Review of Systems  All other systems reviewed and are negative.  Physical Exam: Vital signs and nursing notes reviewed.  Patient Vitals for the past 24 hrs:  BP Temp Temp src Pulse Resp  05/20/24 0225 (!) 115/98 98 F (36.7 C) Oral 87 16    General: AAO x  3, NAD Heart: RRR Lungs:CTAB Abdomen: Gravid, NT Extremities: no edema  Labs:   No results for input(s): WBC, HGB, HCT, PLT in the last 72 hours.  Assessment/Plan: 28 y.o. [id number] at [redacted]w[redacted]d, active labor and precipitous delivery of a viable female. See delivery note.   Plans to breastfeed.  POC discussed with patient and support team, all questions answered.  Dr. Barbette notified of admission/plan of care.  Alan MARLA Molt CNM, MSN 05/20/2024, 2:37 AM   "

## 2024-05-20 NOTE — Lactation Note (Addendum)
 This note was copied from a baby's chart. Lactation Consultation Note  Patient Name: Janet Edwards Unijb'd Date: 05/20/2024 Age:29 hours Reason for consult: Initial assessment;Term  P3, Baby was latched when [facility] entered the room with intermittent swallows.   Mother is experienced with breastfeeding and denies concerns at this time. Feed on demand with cues.  Goal 8-12+ times per day after first 24 hrs.  Place baby STS if not cueing.  Discussed basics including importance of depth, cluster feeding, frequency. Suggest calling if help is needed.   Maternal Data Has patient been taught Hand Expression?: Yes Does the patient have breastfeeding experience prior to this delivery?: Yes How long did the patient breastfeed?: 14 mos.  Feeding Mother's Current Feeding Choice: Breast Milk  LATCH Score Latch: Grasps breast easily, tongue down, lips flanged, rhythmical sucking.  Audible Swallowing: A few with stimulation  Type of Nipple: Everted at rest and after stimulation  Comfort (Breast/Nipple): Soft / non-tender  Hold (Positioning): No assistance needed to correctly position infant at breast.  LATCH Score: 9  Interventions Interventions: Breast feeding basics reviewed;Education;LC Services brochure;CDC milk storage guidelines  Discharge Pump: Personal;Hands Free;Manual  Consult Status Consult Status: PRN   Shannon Levorn Lemme  RN, IBCLC 05/20/2024, 9:11 AM

## 2024-05-20 NOTE — MAU Note (Signed)
..  Janet Edwards is a 28 y.o. at [redacted]w[redacted]d here in MAU reporting: contractions back to back with urger to push. Pt brought directly to room due to intensity of contractions.  GBS- +FM.  Denies leaking of fluid.   Pain score: *** There were no vitals filed for this visit.   FHT: Lab orders placed from triage:

## 2024-05-21 LAB — SYPHILIS: RPR W/REFLEX TO RPR TITER AND TREPONEMAL ANTIBODIES, TRADITIONAL SCREENING AND DIAGNOSIS ALGORITHM: RPR Ser Ql: NONREACTIVE

## 2024-05-21 NOTE — Discharge Summary (Signed)
 "      OB Discharge Summary  Patient Name: Janet Edwards DOB: 1995-06-05 MRN: [id number]  Date of admission: 05/20/2024 Delivering MD: JOSHUA PALMA K  Date of discharge: 05/21/2024  Admitting diagnosis: Pregnant [Z34.90] Intrauterine pregnancy: [redacted]w[redacted]d     Secondary diagnosis:Principal Problem:   Postpartum care following vaginal delivery 12/31 Active Problems:   First degree perineal laceration, hemostatic, no repair   Normal labor   SVD (spontaneous vaginal delivery)  Additional problems:none     Discharge diagnosis: Term Pregnancy Delivered                                                                     Post partum procedures:none  Augmentation: N/A  Complications: None  Hospital course:  Onset of Labor With Vaginal Delivery      29 y.o. yo [id number] at [redacted]w[redacted]d was admitted in Active Labor on 05/20/2024. Labor course was complicated bynothing  Membrane Rupture Time/Date: 2:03 AM,05/20/2024  Delivery Method:Vaginal, Spontaneous Operative Delivery:N/A Episiotomy: None Lacerations:  1st degree;Perineal Patient had a postpartum course complicated by nothing.  She is ambulating, tolerating a regular diet, passing flatus, and urinating well. Patient is discharged home in stable condition on 05/21/2024.  Newborn Data: Birth date:05/20/2024 Birth time:2:06 AM Gender:Female Living status:Living Apgars:9 ,9  Weight:3657 g  Physical exam  Vitals:   05/20/24 1256 05/20/24 1700 05/20/24 2046 05/21/24 0440  BP: 121/73 118/80 115/73 114/65  Pulse: 85 86 89 94  Resp: 16 16 18 18   Temp: 98.2 F (36.8 C) 97.6 F (36.4 C) 98.8 F (37.1 C) 98.7 F (37.1 C)  TempSrc: Axillary Axillary Oral Oral  SpO2: 99% 98% 99% 100%   General: alert, cooperative, and no distress Lochia: appropriate Uterine Fundus: firm Incision: N/A DVT Evaluation: No evidence of DVT seen on physical exam. Labs: Lab Results  Component Value Date   WBC 17.0 (H) 05/20/2024   HGB 10.9 (L) 05/20/2024    HCT 33.3 (L) 05/20/2024   MCV 85.2 05/20/2024   PLT 234 05/20/2024      Latest Ref Rng & Units 11/18/2023    8:57 AM  CMP  Glucose 70 - 99 mg/dL 887   BUN 6 - 20 mg/dL 7   Creatinine 9.55 - 8.99 mg/dL 9.35   Sodium 864 - 854 mmol/L 136   Potassium 3.5 - 5.1 mmol/L 3.5   Chloride 98 - 111 mmol/L 102   CO2 22 - 32 mmol/L 19   Calcium 8.9 - 10.3 mg/dL 9.4   Total Protein 6.5 - 8.1 g/dL 7.8   Total Bilirubin 0.0 - 1.2 mg/dL 1.8   Alkaline Phos 38 - 126 U/L 48   AST 15 - 41 U/L 17   ALT 0 - 44 U/L 14     Discharge instruction: per After Visit Summary and Baby and Me Booklet.  After Visit Meds:  Allergies as of 05/21/2024       Reactions   Penicillins Other (See Comments)   Mother told her she was unsure of what reaction    Pneumococcal Vaccine Other (See Comments)   Hematoma with mild to moderate localized reaction   Vicks Dayquil Cough [dextromethorphan Hbr] Nausea And Vomiting        Medication List  STOP taking these medications    metoCLOPramide  10 MG tablet Commonly known as: REGLAN        TAKE these medications    acetaminophen  325 MG tablet Commonly known as: Tylenol  Take 2 tablets (650 mg total) by mouth every 4 (four) hours as needed (for pain scale < 4).   benzocaine -Menthol  20-0.5 % Aero Commonly known as: DERMOPLAST Apply 1 Application topically as needed for irritation (perineal discomfort).   ondansetron  4 MG tablet Commonly known as: ZOFRAN  Take 4 mg by mouth every 8 (eight) hours as needed for nausea.   pantoprazole 40 MG tablet Commonly known as: PROTONIX Take 40 mg by mouth daily.   PRENATAL PO Take 1 tablet by mouth daily.   scopolamine  1 MG/3DAYS Commonly known as: TRANSDERM-SCOP Place 1 patch (1.5 mg total) onto the skin every 3 (three) days.        Diet: routine diet  Activity: Advance as tolerated. Pelvic rest for 6 weeks.   Outpatient follow up:6 weeks Follow up Appt:No future appointments. Follow up visit: No  follow-ups on file.  Postpartum contraception: Not Discussed  Newborn Data: Live born female  Birth Weight: 8 lb 1 oz (3657 g) APGAR: 9, 9  Newborn Delivery   Birth date/time: 05/20/2024 02:06:35 Delivery type: Vaginal, Spontaneous     Baby Feeding: Breast Disposition:home with mother   05/21/2024 Janet DELENA Bowers, MD   "

## 2024-05-28 ENCOUNTER — Telehealth (HOSPITAL_COMMUNITY): Payer: Self-pay | Admitting: *Deleted

## 2024-05-28 NOTE — Telephone Encounter (Signed)
 05/28/2024  Name: Janet Edwards MRN: [id number] DOB: 01/14/1996  Reason for Call:  Transition of Care Hospital Discharge Call  Contact Status: Patient Contact Status: Message  Language assistant needed:          Follow-Up Questions:    Van Postnatal Depression Scale:  In the Past 7 Days:    PHQ2-9 Depression Scale:     Discharge Follow-up:    Post-discharge interventions: NA  Mliss Sieve, RN 05/28/2024 13:37
